# Patient Record
Sex: Female | Born: 1940 | Race: White | Hispanic: No | Marital: Single | Smoking: Current every day smoker
Health system: Southern US, Community
[De-identification: ages and names within clinical notes are randomized; demographics above are authoritative.]

## PROBLEM LIST (undated history)

## (undated) DIAGNOSIS — E785 Hyperlipidemia, unspecified: Secondary | ICD-10-CM

## (undated) DIAGNOSIS — I10 Essential (primary) hypertension: Secondary | ICD-10-CM

## (undated) DIAGNOSIS — I350 Nonrheumatic aortic (valve) stenosis: Secondary | ICD-10-CM

## (undated) DIAGNOSIS — F419 Anxiety disorder, unspecified: Secondary | ICD-10-CM

## (undated) DIAGNOSIS — R011 Cardiac murmur, unspecified: Secondary | ICD-10-CM

## (undated) DIAGNOSIS — I739 Peripheral vascular disease, unspecified: Secondary | ICD-10-CM

## (undated) DIAGNOSIS — C44621 Squamous cell carcinoma of skin of unspecified upper limb, including shoulder: Secondary | ICD-10-CM

## (undated) DIAGNOSIS — I6523 Occlusion and stenosis of bilateral carotid arteries: Secondary | ICD-10-CM

## (undated) DIAGNOSIS — K219 Gastro-esophageal reflux disease without esophagitis: Secondary | ICD-10-CM

## (undated) DIAGNOSIS — E119 Type 2 diabetes mellitus without complications: Secondary | ICD-10-CM

## (undated) HISTORY — PX: OTHER SURGICAL HISTORY: SHX169

---

## 1971-07-25 HISTORY — PX: VAGINAL HYSTERECTOMY: SUR661

## 2000-08-05 ENCOUNTER — Emergency Department (HOSPITAL_COMMUNITY): Admission: EM | Admit: 2000-08-05 | Discharge: 2000-08-05 | Payer: Self-pay | Admitting: Internal Medicine

## 2000-08-05 ENCOUNTER — Encounter: Payer: Self-pay | Admitting: Internal Medicine

## 2000-12-26 ENCOUNTER — Other Ambulatory Visit: Admission: RE | Admit: 2000-12-26 | Discharge: 2000-12-26 | Payer: Self-pay | Admitting: Otolaryngology

## 2000-12-26 ENCOUNTER — Encounter (INDEPENDENT_AMBULATORY_CARE_PROVIDER_SITE_OTHER): Payer: Self-pay | Admitting: Specialist

## 2003-01-01 ENCOUNTER — Other Ambulatory Visit: Admission: RE | Admit: 2003-01-01 | Discharge: 2003-01-01 | Payer: Self-pay | Admitting: Family Medicine

## 2004-02-26 ENCOUNTER — Encounter: Admission: RE | Admit: 2004-02-26 | Discharge: 2004-02-26 | Payer: Self-pay | Admitting: Family Medicine

## 2005-03-10 ENCOUNTER — Encounter: Admission: RE | Admit: 2005-03-10 | Discharge: 2005-03-10 | Payer: Self-pay | Admitting: Family Medicine

## 2006-03-09 ENCOUNTER — Encounter: Admission: RE | Admit: 2006-03-09 | Discharge: 2006-03-09 | Payer: Self-pay | Admitting: Family Medicine

## 2006-03-13 ENCOUNTER — Encounter: Admission: RE | Admit: 2006-03-13 | Discharge: 2006-03-13 | Payer: Self-pay | Admitting: Family Medicine

## 2006-03-14 ENCOUNTER — Encounter: Admission: RE | Admit: 2006-03-14 | Discharge: 2006-03-14 | Payer: Self-pay | Admitting: Family Medicine

## 2008-11-04 ENCOUNTER — Encounter: Admission: RE | Admit: 2008-11-04 | Discharge: 2008-12-22 | Payer: Self-pay | Admitting: Rheumatology

## 2010-10-05 ENCOUNTER — Other Ambulatory Visit: Payer: Self-pay | Admitting: Internal Medicine

## 2010-10-05 DIAGNOSIS — R0989 Other specified symptoms and signs involving the circulatory and respiratory systems: Secondary | ICD-10-CM

## 2010-10-06 ENCOUNTER — Ambulatory Visit
Admission: RE | Admit: 2010-10-06 | Discharge: 2010-10-06 | Disposition: A | Discharge status: Home / Self Care | Payer: Medicare Other | Source: Ambulatory Visit | Attending: Internal Medicine | Admitting: Internal Medicine

## 2010-10-06 DIAGNOSIS — R0989 Other specified symptoms and signs involving the circulatory and respiratory systems: Secondary | ICD-10-CM

## 2012-10-14 ENCOUNTER — Other Ambulatory Visit: Payer: Self-pay | Admitting: Internal Medicine

## 2012-10-14 DIAGNOSIS — I6523 Occlusion and stenosis of bilateral carotid arteries: Secondary | ICD-10-CM

## 2012-10-18 ENCOUNTER — Ambulatory Visit
Admission: RE | Admit: 2012-10-18 | Discharge: 2012-10-18 | Disposition: A | Discharge status: Home / Self Care | Payer: Medicare PPO | Source: Ambulatory Visit | Attending: Internal Medicine | Admitting: Internal Medicine

## 2012-10-18 DIAGNOSIS — I6523 Occlusion and stenosis of bilateral carotid arteries: Secondary | ICD-10-CM

## 2013-08-26 ENCOUNTER — Encounter (HOSPITAL_COMMUNITY): Payer: Self-pay | Admitting: Emergency Medicine

## 2013-08-26 ENCOUNTER — Inpatient Hospital Stay (HOSPITAL_COMMUNITY)
Admission: EM | Admit: 2013-08-26 | Discharge: 2013-08-30 | DRG: 563 | Disposition: A | Discharge status: Home Health | Payer: Medicare PPO | Attending: Orthopedic Surgery | Admitting: Orthopedic Surgery

## 2013-08-26 ENCOUNTER — Inpatient Hospital Stay (HOSPITAL_COMMUNITY): Payer: Medicare PPO

## 2013-08-26 ENCOUNTER — Emergency Department (HOSPITAL_COMMUNITY): Payer: Medicare PPO

## 2013-08-26 DIAGNOSIS — I658 Occlusion and stenosis of other precerebral arteries: Secondary | ICD-10-CM | POA: Diagnosis present

## 2013-08-26 DIAGNOSIS — I359 Nonrheumatic aortic valve disorder, unspecified: Secondary | ICD-10-CM | POA: Diagnosis present

## 2013-08-26 DIAGNOSIS — J441 Chronic obstructive pulmonary disease with (acute) exacerbation: Secondary | ICD-10-CM | POA: Diagnosis present

## 2013-08-26 DIAGNOSIS — S82201A Unspecified fracture of shaft of right tibia, initial encounter for closed fracture: Secondary | ICD-10-CM | POA: Diagnosis present

## 2013-08-26 DIAGNOSIS — S82209A Unspecified fracture of shaft of unspecified tibia, initial encounter for closed fracture: Principal | ICD-10-CM | POA: Diagnosis present

## 2013-08-26 DIAGNOSIS — N182 Chronic kidney disease, stage 2 (mild): Secondary | ICD-10-CM | POA: Diagnosis present

## 2013-08-26 DIAGNOSIS — I5032 Chronic diastolic (congestive) heart failure: Secondary | ICD-10-CM | POA: Diagnosis present

## 2013-08-26 DIAGNOSIS — Z7982 Long term (current) use of aspirin: Secondary | ICD-10-CM

## 2013-08-26 DIAGNOSIS — F17200 Nicotine dependence, unspecified, uncomplicated: Secondary | ICD-10-CM | POA: Diagnosis present

## 2013-08-26 DIAGNOSIS — E119 Type 2 diabetes mellitus without complications: Secondary | ICD-10-CM | POA: Diagnosis present

## 2013-08-26 DIAGNOSIS — I509 Heart failure, unspecified: Secondary | ICD-10-CM | POA: Diagnosis present

## 2013-08-26 DIAGNOSIS — I10 Essential (primary) hypertension: Secondary | ICD-10-CM

## 2013-08-26 DIAGNOSIS — J4489 Other specified chronic obstructive pulmonary disease: Secondary | ICD-10-CM | POA: Diagnosis present

## 2013-08-26 DIAGNOSIS — R062 Wheezing: Secondary | ICD-10-CM

## 2013-08-26 DIAGNOSIS — I350 Nonrheumatic aortic (valve) stenosis: Secondary | ICD-10-CM

## 2013-08-26 DIAGNOSIS — I739 Peripheral vascular disease, unspecified: Secondary | ICD-10-CM | POA: Diagnosis present

## 2013-08-26 DIAGNOSIS — W19XXXA Unspecified fall, initial encounter: Secondary | ICD-10-CM | POA: Diagnosis present

## 2013-08-26 DIAGNOSIS — R809 Proteinuria, unspecified: Secondary | ICD-10-CM | POA: Diagnosis present

## 2013-08-26 DIAGNOSIS — S82109A Unspecified fracture of upper end of unspecified tibia, initial encounter for closed fracture: Secondary | ICD-10-CM | POA: Diagnosis present

## 2013-08-26 DIAGNOSIS — S82401A Unspecified fracture of shaft of right fibula, initial encounter for closed fracture: Secondary | ICD-10-CM

## 2013-08-26 DIAGNOSIS — I6523 Occlusion and stenosis of bilateral carotid arteries: Secondary | ICD-10-CM | POA: Diagnosis present

## 2013-08-26 DIAGNOSIS — R7989 Other specified abnormal findings of blood chemistry: Secondary | ICD-10-CM | POA: Diagnosis present

## 2013-08-26 DIAGNOSIS — J449 Chronic obstructive pulmonary disease, unspecified: Secondary | ICD-10-CM

## 2013-08-26 DIAGNOSIS — D62 Acute posthemorrhagic anemia: Secondary | ICD-10-CM | POA: Diagnosis present

## 2013-08-26 DIAGNOSIS — E785 Hyperlipidemia, unspecified: Secondary | ICD-10-CM | POA: Diagnosis present

## 2013-08-26 DIAGNOSIS — R011 Cardiac murmur, unspecified: Secondary | ICD-10-CM | POA: Diagnosis present

## 2013-08-26 DIAGNOSIS — I129 Hypertensive chronic kidney disease with stage 1 through stage 4 chronic kidney disease, or unspecified chronic kidney disease: Secondary | ICD-10-CM | POA: Diagnosis present

## 2013-08-26 HISTORY — DX: Hyperlipidemia, unspecified: E78.5

## 2013-08-26 HISTORY — DX: Peripheral vascular disease, unspecified: I73.9

## 2013-08-26 HISTORY — DX: Nonrheumatic aortic (valve) stenosis: I35.0

## 2013-08-26 HISTORY — DX: Type 2 diabetes mellitus without complications: E11.9

## 2013-08-26 HISTORY — DX: Essential (primary) hypertension: I10

## 2013-08-26 HISTORY — DX: Occlusion and stenosis of bilateral carotid arteries: I65.23

## 2013-08-26 LAB — COMPREHENSIVE METABOLIC PANEL
ALBUMIN: 3.1 g/dL — AB (ref 3.5–5.2)
ALT: 14 U/L (ref 0–35)
AST: 17 U/L (ref 0–37)
Alkaline Phosphatase: 74 U/L (ref 39–117)
BILIRUBIN TOTAL: 0.3 mg/dL (ref 0.3–1.2)
BUN: 23 mg/dL (ref 6–23)
CALCIUM: 8.7 mg/dL (ref 8.4–10.5)
CHLORIDE: 100 meq/L (ref 96–112)
CO2: 22 mEq/L (ref 19–32)
CREATININE: 1.79 mg/dL — AB (ref 0.50–1.10)
GFR calc Af Amer: 31 mL/min — ABNORMAL LOW (ref >90)
GFR calc non Af Amer: 27 mL/min — ABNORMAL LOW (ref >90)
Glucose, Bld: 142 mg/dL — ABNORMAL HIGH (ref 70–99)
Potassium: 4.5 mEq/L (ref 3.7–5.3)
Sodium: 137 mEq/L (ref 137–147)
Total Protein: 6.4 g/dL (ref 6.0–8.3)

## 2013-08-26 LAB — CBC WITH DIFFERENTIAL/PLATELET
Basophils Absolute: 0.1 10*3/uL (ref 0.0–0.1)
Basophils Relative: 1 % (ref 0–1)
Eosinophils Absolute: 0.3 10*3/uL (ref 0.0–0.7)
Eosinophils Relative: 2 % (ref 0–5)
HCT: 34.1 % — ABNORMAL LOW (ref 36.0–46.0)
Hemoglobin: 11.8 g/dL — ABNORMAL LOW (ref 12.0–15.0)
LYMPHS ABS: 1 10*3/uL (ref 0.7–4.0)
LYMPHS PCT: 8 % — AB (ref 12–46)
MCH: 29.5 pg (ref 26.0–34.0)
MCHC: 34.6 g/dL (ref 30.0–36.0)
MCV: 85.3 fL (ref 78.0–100.0)
Monocytes Absolute: 0.9 10*3/uL (ref 0.1–1.0)
Monocytes Relative: 7 % (ref 3–12)
NEUTROS ABS: 10.6 10*3/uL — AB (ref 1.7–7.7)
NEUTROS PCT: 82 % — AB (ref 43–77)
PLATELETS: 376 10*3/uL (ref 150–400)
RBC: 4 MIL/uL (ref 3.87–5.11)
RDW: 12.8 % (ref 11.5–15.5)
WBC: 12.8 10*3/uL — AB (ref 4.0–10.5)

## 2013-08-26 LAB — URINALYSIS, ROUTINE W REFLEX MICROSCOPIC
Bilirubin Urine: NEGATIVE
GLUCOSE, UA: NEGATIVE mg/dL
HGB URINE DIPSTICK: NEGATIVE
Ketones, ur: NEGATIVE mg/dL
Leukocytes, UA: NEGATIVE
Nitrite: NEGATIVE
Protein, ur: >300 mg/dL — AB
SPECIFIC GRAVITY, URINE: 1.015 (ref 1.005–1.030)
Urobilinogen, UA: 0.2 mg/dL (ref 0.0–1.0)
pH: 5.5 (ref 5.0–8.0)

## 2013-08-26 LAB — BASIC METABOLIC PANEL
BUN: 21 mg/dL (ref 6–23)
CHLORIDE: 98 meq/L (ref 96–112)
CO2: 24 meq/L (ref 19–32)
Calcium: 9.1 mg/dL (ref 8.4–10.5)
Creatinine, Ser: 1.65 mg/dL — ABNORMAL HIGH (ref 0.50–1.10)
GFR calc Af Amer: 35 mL/min — ABNORMAL LOW (ref >90)
GFR calc non Af Amer: 30 mL/min — ABNORMAL LOW (ref >90)
GLUCOSE: 174 mg/dL — AB (ref 70–99)
POTASSIUM: 4.3 meq/L (ref 3.7–5.3)
SODIUM: 137 meq/L (ref 137–147)

## 2013-08-26 LAB — CBC
HCT: 31.8 % — ABNORMAL LOW (ref 36.0–46.0)
Hemoglobin: 11.1 g/dL — ABNORMAL LOW (ref 12.0–15.0)
MCH: 29.7 pg (ref 26.0–34.0)
MCHC: 34.9 g/dL (ref 30.0–36.0)
MCV: 85 fL (ref 78.0–100.0)
Platelets: 393 10*3/uL (ref 150–400)
RBC: 3.74 MIL/uL — ABNORMAL LOW (ref 3.87–5.11)
RDW: 12.8 % (ref 11.5–15.5)
WBC: 14.8 10*3/uL — ABNORMAL HIGH (ref 4.0–10.5)

## 2013-08-26 LAB — GLUCOSE, CAPILLARY
Glucose-Capillary: 182 mg/dL — ABNORMAL HIGH (ref 70–99)
Glucose-Capillary: 139 mg/dL — ABNORMAL HIGH (ref 70–99)
Glucose-Capillary: 140 mg/dL — ABNORMAL HIGH (ref 70–99)
Glucose-Capillary: 148 mg/dL — ABNORMAL HIGH (ref 70–99)

## 2013-08-26 LAB — URINE MICROSCOPIC-ADD ON

## 2013-08-26 LAB — TYPE AND SCREEN
ABO/RH(D): O POS
ANTIBODY SCREEN: NEGATIVE

## 2013-08-26 LAB — PROTIME-INR
INR: 0.94 (ref 0.00–1.49)
Prothrombin Time: 12.4 seconds (ref 11.6–15.2)

## 2013-08-26 LAB — APTT: APTT: 32 s (ref 24–37)

## 2013-08-26 LAB — ABO/RH: ABO/RH(D): O POS

## 2013-08-26 MED ORDER — SODIUM CHLORIDE 0.9 % IV SOLN
INTRAVENOUS | Status: AC
  Administered 2013-08-26: 12:00:00 via INTRAVENOUS

## 2013-08-26 MED ORDER — INSULIN ASPART 100 UNIT/ML ~~LOC~~ SOLN
0.0000 [IU] | Freq: Three times a day (TID) | SUBCUTANEOUS | Status: DC
  Administered 2013-08-27 – 2013-08-29 (×5): 2 [IU] via SUBCUTANEOUS

## 2013-08-26 MED ORDER — DOCUSATE SODIUM 100 MG PO CAPS
100.0000 mg | ORAL_CAPSULE | Freq: Two times a day (BID) | ORAL | Status: DC
  Administered 2013-08-27 – 2013-08-30 (×6): 100 mg via ORAL
  Filled 2013-08-26 (×8): qty 1

## 2013-08-26 MED ORDER — SIMVASTATIN 5 MG PO TABS
5.0000 mg | ORAL_TABLET | Freq: Every day | ORAL | Status: DC
  Administered 2013-08-27 – 2013-08-30 (×4): 5 mg via ORAL
  Filled 2013-08-26 (×5): qty 1

## 2013-08-26 MED ORDER — SENNOSIDES-DOCUSATE SODIUM 8.6-50 MG PO TABS
1.0000 | ORAL_TABLET | Freq: Every evening | ORAL | Status: DC | PRN
Start: 2013-08-26 — End: 2013-08-30

## 2013-08-26 MED ORDER — MORPHINE SULFATE 2 MG/ML IJ SOLN: 1.0000 mg | INTRAMUSCULAR | Status: DC | PRN

## 2013-08-26 MED ORDER — SODIUM CHLORIDE 0.9 % IV SOLN
INTRAVENOUS | Status: DC
  Administered 2013-08-26 – 2013-08-28 (×7): via INTRAVENOUS

## 2013-08-26 MED ORDER — ONDANSETRON HCL 4 MG/2ML IJ SOLN
4.0000 mg | Freq: Three times a day (TID) | INTRAMUSCULAR | Status: DC
  Administered 2013-08-26 – 2013-08-27 (×2): 4 mg via INTRAVENOUS
  Filled 2013-08-26 (×3): qty 2

## 2013-08-26 MED ORDER — FENTANYL CITRATE 0.05 MG/ML IJ SOLN: 50.0000 ug | INTRAMUSCULAR | Status: DC | PRN

## 2013-08-26 MED ORDER — ONDANSETRON HCL 4 MG/2ML IJ SOLN
4.0000 mg | Freq: Three times a day (TID) | INTRAMUSCULAR | Status: AC | PRN
  Administered 2013-08-26 (×2): 4 mg via INTRAVENOUS
  Filled 2013-08-26 (×2): qty 2

## 2013-08-26 MED ORDER — FENTANYL CITRATE 0.05 MG/ML IJ SOLN
50.0000 ug | Freq: Once | INTRAMUSCULAR | Status: DC
Start: 2013-08-26 — End: 2013-08-27

## 2013-08-26 MED ORDER — METFORMIN HCL 500 MG PO TABS: 500.0000 mg | ORAL_TABLET | Freq: Two times a day (BID) | ORAL | Status: DC

## 2013-08-26 MED ORDER — MORPHINE SULFATE 4 MG/ML IJ SOLN
4.0000 mg | INTRAMUSCULAR | Status: DC | PRN
  Administered 2013-08-26 (×4): 4 mg via INTRAVENOUS
  Filled 2013-08-26 (×4): qty 1

## 2013-08-26 MED ORDER — ALPRAZOLAM 0.5 MG PO TABS: 0.5000 mg | ORAL_TABLET | Freq: Three times a day (TID) | ORAL | Status: DC | PRN

## 2013-08-26 MED ORDER — AMLODIPINE BESYLATE 2.5 MG PO TABS
2.5000 mg | ORAL_TABLET | Freq: Every day | ORAL | Status: DC
  Administered 2013-08-27: 2.5 mg via ORAL
  Filled 2013-08-26 (×3): qty 1

## 2013-08-26 NOTE — ED Notes (Signed)
Pt has sensation to right foot and moderate +2 dorsalis pedis pulses to feet bilaterally. Ortho tech at bedside applying posterior long leg splint.

## 2013-08-26 NOTE — Progress Notes (Signed)
UR completed 

## 2013-08-26 NOTE — ED Notes (Signed)
Pt reports nausea but refused nausea medication at present time.

## 2013-08-26 NOTE — Progress Notes (Signed)
PHARMACIST - PHYSICIAN COMMUNICATION DR:  Manson Passey CONCERNING:  METFORMIN SAFE ADMINISTRATION POLICY  RECOMMENDATION: Metformin has been placed on DISCONTINUE (rejected order) STATUS and should be reordered only after any of the conditions below are ruled out.  Current safety recommendations include avoiding metformin for a minimum of 48 hours after the patient's exposure to intravenous contrast media.  DESCRIPTION:  The Pharmacy Committee has adopted a policy that restricts the use of metformin in hospitalized patients until all the contraindications to administration have been ruled out. Specific contraindications are: []  Serum creatinine ? 1.5 for males [x]  Serum creatinine ? 1.4 for females []  Shock, acute MI, sepsis, hypoxemia, dehydration []  Planned administration of intravenous iodinated contrast media []  Heart Failure patients with low EF []  Acute or chronic metabolic acidosis (including DKA)   Uvaldo Rising, BCPS  Clinical Pharmacist Pager [phone]  08/26/2013 9:08 PM

## 2013-08-26 NOTE — ED Provider Notes (Signed)
CSN: [id number]     Arrival date & time 08/26/13  0916 History   First MD Initiated Contact with Patient 08/26/13 [phone]     Chief Complaint  Patient presents with  . Fall   (Consider location/radiation/quality/duration/timing/severity/associated sxs/prior Treatment) HPI Comments: 73 yo female with smoking, DM hx presents with right leg pain after falling on the on the cement floor due to missing last step walking to basement in the dark, power was out.  Pt has had fracture of left leg before. Pain with movement.  Pt walked initially.  ASA is blood thinner.  No head injury or loc.  Occurred PTA.  Smoker.    Patient is a 73 y.o. female presenting with fall. The history is provided by the patient.  Fall Pertinent negatives include no chest pain, no abdominal pain, no headaches and no shortness of breath.    Past Medical History  Diagnosis Date  . Diabetes mellitus without complication   . Hypertension   . Hyperlipemia    History reviewed. No pertinent past surgical history. No family history on file. History  Substance Use Topics  . Smoking status: Current Every Day Smoker -- 0.50 packs/day    Types: Cigarettes  . Smokeless tobacco: Not on file  . Alcohol Use: No   OB History   Grav Para Term Preterm Abortions TAB SAB Ect Mult Living                 Review of Systems  Constitutional: Negative for fever and chills.  HENT: Negative for congestion.   Eyes: Negative for visual disturbance.  Respiratory: Negative for shortness of breath.   Cardiovascular: Negative for chest pain.  Gastrointestinal: Negative for vomiting and abdominal pain.  Genitourinary: Negative for dysuria and flank pain.  Musculoskeletal: Positive for arthralgias, gait problem and joint swelling. Negative for back pain, neck pain and neck stiffness.  Skin: Negative for rash.  Neurological: Negative for light-headedness and headaches.    Allergies  Review of patient's allergies indicates no known  allergies.  Home Medications   Current Outpatient Rx  Name  Route  Sig  Dispense  Refill  . ALPRAZolam (XANAX) 0.5 MG tablet   Oral   Take 0.5 mg by mouth as needed for anxiety.         Marland Kitchen amLODipine (NORVASC) 2.5 MG tablet   Oral   Take 2.5 mg by mouth daily.         Marland Kitchen aspirin 325 MG tablet   Oral   Take 325 mg by mouth daily.         . calcium carbonate (OS-CAL) 600 MG TABS tablet   Oral   Take 600 mg by mouth 2 (two) times daily with a meal.         . Coenzyme Q10 (CO Q 10) 100 MG CAPS   Oral   Take 1 capsule by mouth daily.         . cyanocobalamin 1000 MCG tablet   Oral   Take 100 mcg by mouth daily.         . cyanocobalamin 500 MCG tablet   Oral   Take 500 mcg by mouth daily.         . metFORMIN (GLUCOPHAGE) 500 MG tablet   Oral   Take 500 mg by mouth 2 (two) times daily with a meal.         . Multiple Vitamins-Minerals (MULTIVITAMIN WITH MINERALS) tablet   Oral   Take 1 tablet by  mouth daily.         Marland Kitchen omega-3 acid ethyl esters (LOVAZA) 1 G capsule   Oral   Take 1 g by mouth daily.         . simvastatin (ZOCOR) 5 MG tablet   Oral   Take 5 mg by mouth daily.          BP 120/52  Pulse 96  Temp(Src) 98.6 F (37 C) (Oral)  Resp 18  SpO2 94% Physical Exam  Nursing note and vitals reviewed. Constitutional: She is oriented to person, place, and time. She appears well-developed and well-nourished.  HENT:  Head: Normocephalic and atraumatic.  Eyes: Conjunctivae are normal. Right eye exhibits no discharge. Left eye exhibits no discharge.  Neck: Normal range of motion. Neck supple. No tracheal deviation present.  Cardiovascular: Normal rate and regular rhythm.   Pulmonary/Chest: Effort normal and breath sounds normal.  Abdominal: Soft. She exhibits no distension. There is no tenderness. There is no guarding.  Musculoskeletal: She exhibits edema and tenderness.  Tender and deformity prox tibia, swelling lateral knee and proximal  tibia with mild ecchymosis, closed, NV intact distal, swelling but soft compartment No hip pain bilateral. Full rom of left hip. Knee and ankle  Neurological: She is alert and oriented to person, place, and time.  Skin: Skin is warm. No rash noted.  Psychiatric: She has a normal mood and affect.    ED Course  Procedures (including critical care time) Labs Review Labs Reviewed  GLUCOSE, CAPILLARY - Abnormal; Notable for the following:    Glucose-Capillary 182 (*)    All other components within normal limits  CBC WITH DIFFERENTIAL - Abnormal; Notable for the following:    WBC 12.8 (*)    Hemoglobin 11.8 (*)    HCT 34.1 (*)    Neutrophils Relative % 82 (*)    Neutro Abs 10.6 (*)    Lymphocytes Relative 8 (*)    All other components within normal limits  BASIC METABOLIC PANEL - Abnormal; Notable for the following:    Glucose, Bld 174 (*)    Creatinine, Ser 1.65 (*)    GFR calc non Af Amer 30 (*)    GFR calc Af Amer 35 (*)    All other components within normal limits  TYPE AND SCREEN  ABO/RH   Imaging Review Dg Chest 1 View  08/26/2013   CLINICAL DATA:  Fall today, preop, no chest complaints  EXAM: CHEST - 1 VIEW  COMPARISON:  03/09/2006  FINDINGS: Mild background interstitial changes stable. Heart size and vascular pattern are normal. Lungs are clear. No effusion or pneumothorax.  IMPRESSION: No active disease.   Electronically Signed   By: Skipper Cliche M.D.   On: 08/26/2013 10:49   Dg Hip Complete Right  08/26/2013   CLINICAL DATA:  Fall today with pain and upper tibia and fibula  EXAM: RIGHT HIP - COMPLETE 2+ VIEW  COMPARISON:  None.  FINDINGS: There is no evidence of hip fracture or dislocation. There is no evidence of arthropathy or other focal bone abnormality.  IMPRESSION: Negative.   Electronically Signed   By: Skipper Cliche M.D.   On: 08/26/2013 11:07   Dg Knee 2 Views Right  08/26/2013   CLINICAL DATA:  Pain post fall  EXAM: RIGHT KNEE - 1-2 VIEW  COMPARISON:  None   FINDINGS: Osseous demineralization.  Markedly comminuted and displaced proximal right tibial meta diaphyseal fracture.  Probable extension of fracture planes into lateral compartment.  Femur  and patella appear grossly intact.  Large knee joint effusion.  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  Displaced fibular neck fracture.  IMPRESSION: Comminuted proximal right tibial meta diaphyseal fracture with probable intra-articular extension at lateral compartment.  Displaced right fibular neck fracture.  Marked osseous demineralization with degenerative changes and large joint effusion.   Electronically Signed   By: Lavonia Dana M.D.   On: 08/26/2013 10:53   Dg Tibia/fibula Right  08/26/2013   CLINICAL DATA:  Fall today, pain in upper right tibia and fibula  EXAM: RIGHT TIBIA AND FIBULA - 2 VIEW  COMPARISON:  None.  FINDINGS: There is a mildly displaced significantly comminuted fracture of the proximal tibia. This extends from the central tibial plateau through the tibial metaphysis and into the proximal third of the tibial shaft. There is also a mildly comminuted and mildly displaced fracture of the neck of the fibula.  Incidental note is made of popliteal vascular calcification and of nonspecific subcutaneous calcification anterior inferior to the patella. There appears to be a small knee joint effusion.  IMPRESSION: Comminuted mildly displaced fractures of the proximal tibia and fibula   Electronically Signed   By: Skipper Cliche M.D.   On: 08/26/2013 11:06    EKG Interpretation   None       MDM   1. Closed right tibial fracture   2. Fall   3. Right fibular fracture    Mechanical fall. Pain meds ordered. Xray reviewed by myself, complex prox tibial/ plateau fx.  Spoke with ortho, Dr Rolena Infante arranged transfer to Dr Marcelino Scot at [facility] for surgery.  Rechecked, pain controlled, nv intact, ortho tech arrived for knee immobilizer.   The patients results and  plan were reviewed and discussed.   Any x-rays performed were personally reviewed by myself.   Differential diagnosis were considered with the presenting HPI.  Diagnosis: above  Admission/ observation were discussed with the admitting physician, patient and/or family and they are comfortable with the plan.     Mariea Clonts, MD 08/26/13 [phone]

## 2013-08-26 NOTE — H&P (Signed)
Agree with above Spoke with Dr Marcelino Scot  - transfer to [facility] for ORIF     - bulky LE splint in place    - CT scan pending

## 2013-08-26 NOTE — ED Notes (Signed)
Pt resting at present time and RA sat of 89%. Pt placed on 2 lpm [place]. Pt a/o x4.

## 2013-08-26 NOTE — ED Notes (Addendum)
Pt reports pain increasing 8/10. MD Rolena Infante notified regarding pt pain level and next dose not scheduled for another hour. MD Karlene Lineman to consult EDP regarding pt condition. EDP J Knapp verbalizes given 4 mg of morphine now with knowledge last dose given at 1705. Pt reports numbness to right foot. Pt has good cap refill and able to move toes. EDP Hillard Danker request ortho to adjust splint for comfort. Ortho paged and at bedside. Pt a/o x4.

## 2013-08-26 NOTE — ED Notes (Signed)
[facility] called by A Cox at time of pt bed assignment. A  Cox called [facility] to check on transfer status. [facility] staff report transfer will be within next 45 minutes.

## 2013-08-26 NOTE — ED Notes (Signed)
Pt vomited diet coke due to nausea. Pt drinking slow sips of ginger ale at present time.

## 2013-08-26 NOTE — ED Notes (Addendum)
Spoke with Heather on 5N to update on pt status. Aware pt on 2 lpm [place] and [facility] report will transfer in approx 45 minutes.

## 2013-08-26 NOTE — ED Notes (Signed)
Ortho consult at bedside

## 2013-08-26 NOTE — ED Notes (Signed)
Pt transported to CT ?

## 2013-08-26 NOTE — ED Notes (Signed)
Dr Alyson Ingles called regarding pt request to eat. Current status NPO. MD reports will put in for regular schedule and pt NPO after midnight. Pt given diet coke at present time per pt request to settle stomach.

## 2013-08-26 NOTE — ED Notes (Addendum)
Per EMS pt fell going down stairs. On arrival pt had moved self up to top of stairs. Pt a/ox4 with no LOC. Pt ambulates at home independently. Swelling to right leg at knee and mid lower leg. Pt denies numbness to injured area. Soft splint applied to RLE per EMS.

## 2013-08-26 NOTE — ED Notes (Signed)
Pt denies pain medication at present time. Pt states will inform staff if would like something for pain.

## 2013-08-26 NOTE — ED Notes (Signed)
Bed: HY38 Expected date:  Expected time:  Means of arrival:  Comments: EMS FALL

## 2013-08-26 NOTE — ED Notes (Signed)
Pt reports pain decreased 5/10 in right leg. Pt reports will alert staff with increase in pain.

## 2013-08-26 NOTE — H&P (Signed)
Lindsey Weber is an 73 y.o. female.   Chief Complaint:  Right tib/fib fracture HPI:  Patient states that earlier today she missed the bottom step and fell.  Severe pain in right leg and was unable to weightbear.  Past Medical History  Diagnosis Date  . Diabetes mellitus without complication   . Hypertension   . Hyperlipemia     Past Surgical History  Procedure Laterality Date  . Bladder tack    . Abdominal hysterectomy      PARTIAL    History reviewed. No pertinent family history. Social History:  reports that she has been smoking Cigarettes.  She has been smoking about 0.50 packs per day. She has never used smokeless tobacco. She reports that she does not drink alcohol or use illicit drugs.  Allergies: No Known Allergies   (Not in a hospital admission)  Results for orders placed during the hospital encounter of 08/26/13 (from the past 48 hour(s))  GLUCOSE, CAPILLARY     Status: Abnormal   Collection Time    08/26/13  9:18 AM      Result Value Range   Glucose-Capillary 182 (*) 70 - 99 mg/dL  ABO/RH     Status: None   Collection Time    08/26/13  9:54 AM      Result Value Range   ABO/RH(D) O POS    CBC WITH DIFFERENTIAL     Status: Abnormal   Collection Time    08/26/13 10:05 AM      Result Value Range   WBC 12.8 (*) 4.0 - 10.5 K/uL   RBC 4.00  3.87 - 5.11 MIL/uL   Hemoglobin 11.8 (*) 12.0 - 15.0 g/dL   HCT 57.3 (*) 34.4 - 83.0 %   MCV 85.3  78.0 - 100.0 fL   MCH 29.5  26.0 - 34.0 pg   MCHC 34.6  30.0 - 36.0 g/dL   RDW 15.9  96.8 - 95.7 %   Platelets 376  150 - 400 K/uL   Neutrophils Relative % 82 (*) 43 - 77 %   Neutro Abs 10.6 (*) 1.7 - 7.7 K/uL   Lymphocytes Relative 8 (*) 12 - 46 %   Lymphs Abs 1.0  0.7 - 4.0 K/uL   Monocytes Relative 7  3 - 12 %   Monocytes Absolute 0.9  0.1 - 1.0 K/uL   Eosinophils Relative 2  0 - 5 %   Eosinophils Absolute 0.3  0.0 - 0.7 K/uL   Basophils Relative 1  0 - 1 %   Basophils Absolute 0.1  0.0 - 0.1 K/uL  BASIC METABOLIC  PANEL     Status: Abnormal   Collection Time    08/26/13 10:05 AM      Result Value Range   Sodium 137  137 - 147 mEq/L   Potassium 4.3  3.7 - 5.3 mEq/L   Chloride 98  96 - 112 mEq/L   CO2 24  19 - 32 mEq/L   Glucose, Bld 174 (*) 70 - 99 mg/dL   BUN 21  6 - 23 mg/dL   Creatinine, Ser 0.22 (*) 0.50 - 1.10 mg/dL   Calcium 9.1  8.4 - 02.6 mg/dL   GFR calc non Af Amer 30 (*) >90 mL/min   GFR calc Af Amer 35 (*) >90 mL/min   Comment: (NOTE)     The eGFR has been calculated using the CKD EPI equation.     This calculation has not been validated in all clinical  situations.     eGFR's persistently <90 mL/min signify possible Chronic Kidney     Disease.  TYPE AND SCREEN     Status: None   Collection Time    08/26/13 10:05 AM      Result Value Range   ABO/RH(D) O POS     Antibody Screen NEG     Sample Expiration 08/29/2013     Dg Chest 1 View  08/26/2013   CLINICAL DATA:  Fall today, preop, no chest complaints  EXAM: CHEST - 1 VIEW  COMPARISON:  03/09/2006  FINDINGS: Mild background interstitial changes stable. Heart size and vascular pattern are normal. Lungs are clear. No effusion or pneumothorax.  IMPRESSION: No active disease.   Electronically Signed   By: Skipper Cliche M.D.   On: 08/26/2013 10:49   Dg Hip Complete Right  08/26/2013   CLINICAL DATA:  Fall today with pain and upper tibia and fibula  EXAM: RIGHT HIP - COMPLETE 2+ VIEW  COMPARISON:  None.  FINDINGS: There is no evidence of hip fracture or dislocation. There is no evidence of arthropathy or other focal bone abnormality.  IMPRESSION: Negative.   Electronically Signed   By: Skipper Cliche M.D.   On: 08/26/2013 11:07   Dg Knee 2 Views Right  08/26/2013   CLINICAL DATA:  Pain post fall  EXAM: RIGHT KNEE - 1-2 VIEW  COMPARISON:  None  FINDINGS: Osseous demineralization.  Markedly comminuted and displaced proximal right tibial meta diaphyseal fracture.  Probable extension of fracture planes into lateral compartment.  Femur and  patella appear grossly intact.  Large knee joint effusion.  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  Displaced fibular neck fracture.  IMPRESSION: Comminuted proximal right tibial meta diaphyseal fracture with probable intra-articular extension at lateral compartment.  Displaced right fibular neck fracture.  Marked osseous demineralization with degenerative changes and large joint effusion.   Electronically Signed   By: Lavonia Dana M.D.   On: 08/26/2013 10:53   Dg Tibia/fibula Right  08/26/2013   CLINICAL DATA:  Fall today, pain in upper right tibia and fibula  EXAM: RIGHT TIBIA AND FIBULA - 2 VIEW  COMPARISON:  None.  FINDINGS: There is a mildly displaced significantly comminuted fracture of the proximal tibia. This extends from the central tibial plateau through the tibial metaphysis and into the proximal third of the tibial shaft. There is also a mildly comminuted and mildly displaced fracture of the neck of the fibula.  Incidental note is made of popliteal vascular calcification and of nonspecific subcutaneous calcification anterior inferior to the patella. There appears to be a small knee joint effusion.  IMPRESSION: Comminuted mildly displaced fractures of the proximal tibia and fibula   Electronically Signed   By: Skipper Cliche M.D.   On: 08/26/2013 11:06    Review of Systems  Constitutional: Negative.   HENT: Negative.   Eyes: Negative.   Respiratory: Negative.   Cardiovascular: Negative.   Gastrointestinal: Negative.   Musculoskeletal: Positive for falls and joint pain.  Neurological: Negative.   Psychiatric/Behavioral: Negative.     Blood pressure 116/51, pulse 92, temperature 98.6 F (37 C), temperature source Oral, resp. rate 18, SpO2 95.00%. Physical Exam  Constitutional: She is oriented to person, place, and time. No distress.  HENT:  Head: Normocephalic and atraumatic.  Eyes: EOM are normal. Pupils are equal, round, and reactive  to light.  Neck: Normal range of motion.  Respiratory: Effort normal. She has wheezes.  GI:  Soft.  Musculoskeletal:  Right le bulky splint on. Good capillary refill. Moves toes well.    Neurological: She is alert and oriented to person, place, and time.  Skin: Skin is warm.     Assessment/Plan Right comminuted tibia fracture.   Dr Rolena Infante spoke with Ortho trauma surgeon Dr Legrand Como handy.  We will transfer patient to [facility] and dr handy will see patient there for treatment.  Order CT tibia.  This will include knee.  Spoke with patient and family member present.  Advised that surgical intervention will very likely be indicated.   Questions answered.    OWENS,JAMES M 08/26/2013, 12:17 PM

## 2013-08-26 NOTE — ED Notes (Signed)
Pt denies pain medication at present time. Pt states she will alert staff if would like something for pain. Pt going to xray at present time.

## 2013-08-27 ENCOUNTER — Inpatient Hospital Stay (HOSPITAL_COMMUNITY): Payer: Medicare PPO

## 2013-08-27 ENCOUNTER — Encounter (HOSPITAL_COMMUNITY): Payer: Self-pay | Admitting: Orthopedic Surgery

## 2013-08-27 DIAGNOSIS — I6523 Occlusion and stenosis of bilateral carotid arteries: Secondary | ICD-10-CM | POA: Diagnosis present

## 2013-08-27 DIAGNOSIS — R062 Wheezing: Secondary | ICD-10-CM

## 2013-08-27 DIAGNOSIS — E119 Type 2 diabetes mellitus without complications: Secondary | ICD-10-CM | POA: Diagnosis present

## 2013-08-27 DIAGNOSIS — R7989 Other specified abnormal findings of blood chemistry: Secondary | ICD-10-CM | POA: Diagnosis present

## 2013-08-27 DIAGNOSIS — E785 Hyperlipidemia, unspecified: Secondary | ICD-10-CM | POA: Diagnosis present

## 2013-08-27 DIAGNOSIS — J449 Chronic obstructive pulmonary disease, unspecified: Secondary | ICD-10-CM

## 2013-08-27 DIAGNOSIS — S82209A Unspecified fracture of shaft of unspecified tibia, initial encounter for closed fracture: Secondary | ICD-10-CM

## 2013-08-27 DIAGNOSIS — J441 Chronic obstructive pulmonary disease with (acute) exacerbation: Secondary | ICD-10-CM | POA: Diagnosis present

## 2013-08-27 DIAGNOSIS — R809 Proteinuria, unspecified: Secondary | ICD-10-CM | POA: Diagnosis present

## 2013-08-27 DIAGNOSIS — I10 Essential (primary) hypertension: Secondary | ICD-10-CM | POA: Diagnosis present

## 2013-08-27 DIAGNOSIS — R799 Abnormal finding of blood chemistry, unspecified: Secondary | ICD-10-CM

## 2013-08-27 DIAGNOSIS — I739 Peripheral vascular disease, unspecified: Secondary | ICD-10-CM | POA: Diagnosis present

## 2013-08-27 DIAGNOSIS — I359 Nonrheumatic aortic valve disorder, unspecified: Secondary | ICD-10-CM

## 2013-08-27 DIAGNOSIS — R011 Cardiac murmur, unspecified: Secondary | ICD-10-CM | POA: Diagnosis present

## 2013-08-27 LAB — URINE CULTURE
COLONY COUNT: NO GROWTH
Culture: NO GROWTH

## 2013-08-27 LAB — GLUCOSE, CAPILLARY
GLUCOSE-CAPILLARY: 136 mg/dL — AB (ref 70–99)
GLUCOSE-CAPILLARY: 135 mg/dL — AB (ref 70–99)
GLUCOSE-CAPILLARY: 139 mg/dL — AB (ref 70–99)
Glucose-Capillary: 133 mg/dL — ABNORMAL HIGH (ref 70–99)
Glucose-Capillary: 133 mg/dL — ABNORMAL HIGH (ref 70–99)

## 2013-08-27 LAB — RENAL FUNCTION PANEL
Albumin: 3 g/dL — ABNORMAL LOW (ref 3.5–5.2)
BUN: 30 mg/dL — AB (ref 6–23)
CHLORIDE: 103 meq/L (ref 96–112)
CO2: 23 mEq/L (ref 19–32)
CREATININE: 2.17 mg/dL — AB (ref 0.50–1.10)
Calcium: 8.1 mg/dL — ABNORMAL LOW (ref 8.4–10.5)
GFR calc Af Amer: 25 mL/min — ABNORMAL LOW (ref >90)
GFR calc non Af Amer: 22 mL/min — ABNORMAL LOW (ref >90)
Glucose, Bld: 137 mg/dL — ABNORMAL HIGH (ref 70–99)
PHOSPHORUS: 3.7 mg/dL (ref 2.3–4.6)
Potassium: 5.1 mEq/L (ref 3.7–5.3)
Sodium: 139 mEq/L (ref 137–147)

## 2013-08-27 LAB — HEMOGLOBIN A1C
HEMOGLOBIN A1C: 7.1 % — AB (ref <5.7)
MEAN PLASMA GLUCOSE: 157 mg/dL — AB (ref <117)

## 2013-08-27 LAB — SURGICAL PCR SCREEN
MRSA, PCR: NEGATIVE
Staphylococcus aureus: NEGATIVE

## 2013-08-27 MED ORDER — LEVOFLOXACIN 500 MG PO TABS
500.0000 mg | ORAL_TABLET | Freq: Every day | ORAL | Status: DC
  Administered 2013-08-27 – 2013-08-29 (×3): 500 mg via ORAL
  Filled 2013-08-27 (×3): qty 1

## 2013-08-27 MED ORDER — DIPHENHYDRAMINE HCL 25 MG PO CAPS: 25.0000 mg | ORAL_CAPSULE | Freq: Four times a day (QID) | ORAL | Status: DC | PRN

## 2013-08-27 MED ORDER — ALBUTEROL SULFATE (2.5 MG/3ML) 0.083% IN NEBU
2.5000 mg | INHALATION_SOLUTION | Freq: Four times a day (QID) | RESPIRATORY_TRACT | Status: DC
  Administered 2013-08-27 – 2013-08-28 (×3): 2.5 mg via RESPIRATORY_TRACT
  Filled 2013-08-27 (×4): qty 3

## 2013-08-27 MED ORDER — ALBUTEROL SULFATE (2.5 MG/3ML) 0.083% IN NEBU: 2.5000 mg | INHALATION_SOLUTION | RESPIRATORY_TRACT | Status: DC | PRN

## 2013-08-27 MED ORDER — ACETAMINOPHEN 325 MG PO TABS
325.0000 mg | ORAL_TABLET | Freq: Four times a day (QID) | ORAL | Status: DC | PRN
  Administered 2013-08-27 – 2013-08-29 (×6): 650 mg via ORAL
  Filled 2013-08-27 (×7): qty 2

## 2013-08-27 MED ORDER — ENOXAPARIN SODIUM 30 MG/0.3ML ~~LOC~~ SOLN
30.0000 mg | SUBCUTANEOUS | Status: DC
  Administered 2013-08-27 – 2013-08-29 (×3): 30 mg via SUBCUTANEOUS
  Filled 2013-08-27 (×4): qty 0.3

## 2013-08-27 MED ORDER — ALBUTEROL SULFATE (2.5 MG/3ML) 0.083% IN NEBU: 2.5000 mg | INHALATION_SOLUTION | Freq: Four times a day (QID) | RESPIRATORY_TRACT | Status: DC | PRN

## 2013-08-27 MED ORDER — ASPIRIN 81 MG PO CHEW
81.0000 mg | CHEWABLE_TABLET | Freq: Every day | ORAL | Status: DC
  Administered 2013-08-28 – 2013-08-30 (×3): 81 mg via ORAL
  Filled 2013-08-27 (×3): qty 1

## 2013-08-27 MED ORDER — METHOCARBAMOL 100 MG/ML IJ SOLN
1000.0000 mg | Freq: Four times a day (QID) | INTRAVENOUS | Status: DC | PRN
  Filled 2013-08-27: qty 10

## 2013-08-27 MED ORDER — HYDROMORPHONE HCL PF 1 MG/ML IJ SOLN
0.5000 mg | INTRAMUSCULAR | Status: DC | PRN
Start: 2013-08-27 — End: 2013-08-30
  Administered 2013-08-27: 0.5 mg via INTRAVENOUS
  Filled 2013-08-27 (×2): qty 1

## 2013-08-27 MED ORDER — HYDROCODONE-ACETAMINOPHEN 5-325 MG PO TABS: 1.0000 | ORAL_TABLET | Freq: Four times a day (QID) | ORAL | Status: DC | PRN

## 2013-08-27 MED ORDER — METHOCARBAMOL 500 MG PO TABS
500.0000 mg | ORAL_TABLET | Freq: Four times a day (QID) | ORAL | Status: DC | PRN
  Administered 2013-08-29: 500 mg via ORAL
  Filled 2013-08-27: qty 1

## 2013-08-27 MED ORDER — ALBUTEROL SULFATE (2.5 MG/3ML) 0.083% IN NEBU
2.5000 mg | INHALATION_SOLUTION | Freq: Once | RESPIRATORY_TRACT | Status: AC
  Administered 2013-08-27: 2.5 mg via RESPIRATORY_TRACT
  Filled 2013-08-27: qty 3

## 2013-08-27 NOTE — Progress Notes (Signed)
Echocardiogram and chest x-ray completed, spoke with Dr. Broadus John regarding results.  Awaiting new orders regarding antibiotic related to chest x-ray results.  Nursing will continue to monitor.

## 2013-08-27 NOTE — Consult Note (Addendum)
[facility] Medical Consultation  MEMORY Lindsey Weber:[id number] DOB: 05-04-1941 DOA: 08/26/2013 PCP: [name], MD   Requesting physician: Orthopedics  Date of consultation: 2/4 Reason for consultation: Medical management  Impression/Recommendations 1. right tibial fracture -Per orthopedics -DVT prophylaxis -Cardiac risk: Intermediate,  -murmur noted will FU 2-D echocardiogram  2. suspected COPD/long-standing tobacco abuse -Mild wheezing noted at this time -Albuterol when necessary for now -Check two-view chest x-ray -Counseled  3. diabetes mellitus -Hold metformin, sliding scale insulin -Check HbA1c  4. renal insufficiency -Likely chronic no baseline labs, await nephrotoxic agents -UA with proteinuria  5. carotid artery disease -Start ASA, continue statin -FU with vascular  6. Hypertension -Stable, continue amlodipine  Thank you for the consult we will followup  Lindsey Polite, MD [phone]  Chief Complaint: Fall/pain right leg  HPI:  Lindsey Weber is a 73 year old white female history of diabetes, hypertension, suspected COPD/long-standing tobacco use, dyslipidemia was admitted to the orthopedic service last night following a fall and complex right tibial plateau and shaft fracture. Hospitalists were consulted for medical management. Patient denies any specific symptoms at this time, denies any history of cardiac problems, denies chest pain or shortness of breath at rest or with exertion. She has noticed occasional wheezing, has cut down her smoking to 5 cigarettes a day now. At this point awaiting surgery on Friday  Review of Systems:  12 system review negative except for occasional wheezing  Past Medical History  Diagnosis Date  . Diabetes mellitus without complication   . Hypertension   . Hyperlipemia   . COPD (chronic obstructive pulmonary disease)   . Carotid stenosis, bilateral   . Peripheral vascular disease    Past Surgical History   Procedure Laterality Date  . Bladder tack    . Abdominal hysterectomy      PARTIAL   Social History:  reports that she has been smoking Cigarettes.  She has been smoking about 0.50 packs per day. She has never used smokeless tobacco. She reports that she does not drink alcohol or use illicit drugs.  No Known Allergies History reviewed. No pertinent family history.  Prior to Admission medications   Medication Sig Start Date End Date Taking? Authorizing Provider  ALPRAZolam Duanne Moron) 0.5 MG tablet Take 0.5 mg by mouth as needed for anxiety.   Yes Historical Provider, MD  amLODipine (NORVASC) 2.5 MG tablet Take 2.5 mg by mouth daily.   Yes Historical Provider, MD  aspirin 325 MG tablet Take 325 mg by mouth daily.   Yes Historical Provider, MD  calcium carbonate (OS-CAL) 600 MG TABS tablet Take 600 mg by mouth 2 (two) times daily with a meal.   Yes Historical Provider, MD  Coenzyme Q10 (CO Q 10) 100 MG CAPS Take 1 capsule by mouth daily.   Yes Historical Provider, MD  cyanocobalamin 1000 MCG tablet Take 100 mcg by mouth daily.   Yes Historical Provider, MD  cyanocobalamin 500 MCG tablet Take 500 mcg by mouth daily.   Yes Historical Provider, MD  metFORMIN (GLUCOPHAGE) 500 MG tablet Take 500 mg by mouth 2 (two) times daily with a meal.   Yes Historical Provider, MD  Multiple Vitamins-Minerals (MULTIVITAMIN WITH MINERALS) tablet Take 1 tablet by mouth daily.   Yes Historical Provider, MD  omega-3 acid ethyl esters (LOVAZA) 1 G capsule Take 1 g by mouth daily.   Yes Historical Provider, MD  simvastatin (ZOCOR) 5 MG tablet Take 5 mg by mouth daily.   Yes Historical Provider, MD   Physical Exam:  Blood pressure 142/45, pulse 86, temperature 99 F (37.2 C), temperature source Oral, resp. rate 18, height 5\' 8"  (1.727 m), weight 70.308 kg (155 lb), SpO2 95.00%. Filed Vitals:   08/27/13 1000  BP: 142/45  Pulse:   Temp:   Resp:      General:  Alert awake oriented x3 no distress  HEENT: PERRLA,  EOMI,  CVS S1-S2 regular rate rhythm pansystolic murmur noted  Lungs: Scattered expiratory wheezes noted and occasional rhonchi  Abdomen soft nontender nondistended positive bowel sounds no organomegaly  Extremities: Right lower leg immobilized, no edema clubbing or cyanosis 2+ peripheral pulses  Skin no rashes extensive gastric appropriate mood and affect  Neuro nonfocal  Labs on Admission:  Basic Metabolic Panel:  Recent Labs Lab 08/26/13 1005 08/26/13 1245 08/27/13 1120  NA 137 137 139  K 4.3 4.5 5.1  CL 98 100 103  CO2 24 22 23   GLUCOSE 174* 142* 137*  BUN 21 23 30*  CREATININE 1.65* 1.79* 2.17*  CALCIUM 9.1 8.7 8.1*  PHOS  --   --  3.7   Liver Function Tests:  Recent Labs Lab 08/26/13 1245 08/27/13 1120  AST 17  --   ALT 14  --   ALKPHOS 74  --   BILITOT 0.3  --   PROT 6.4  --   ALBUMIN 3.1* 3.0*   No results found for this basename: LIPASE, AMYLASE,  in the last 168 hours No results found for this basename: AMMONIA,  in the last 168 hours CBC:  Recent Labs Lab 08/26/13 1005 08/26/13 1245  WBC 12.8* 14.8*  NEUTROABS 10.6*  --   HGB 11.8* 11.1*  HCT 34.1* 31.8*  MCV 85.3 85.0  PLT 376 393   Cardiac Enzymes: No results found for this basename: CKTOTAL, CKMB, CKMBINDEX, TROPONINI,  in the last 168 hours BNP: No components found with this basename: POCBNP,  CBG:  Recent Labs Lab 08/26/13 1452 08/26/13 1833 08/27/13 0703 08/27/13 1126 08/27/13 1254  GLUCAP 140* 148* 133* 133* 136*    Radiological Exams on Admission: Dg Chest 1 View  08/26/2013   CLINICAL DATA:  Fall today, preop, no chest complaints  EXAM: CHEST - 1 VIEW  COMPARISON:  03/09/2006  FINDINGS: Mild background interstitial changes stable. Heart size and vascular pattern are normal. Lungs are clear. No effusion or pneumothorax.  IMPRESSION: No active disease.   Electronically Signed   By: Skipper Cliche M.D.   On: 08/26/2013 10:49   Dg Hip Complete Right  08/26/2013   CLINICAL  DATA:  Fall today with pain and upper tibia and fibula  EXAM: RIGHT HIP - COMPLETE 2+ VIEW  COMPARISON:  None.  FINDINGS: There is no evidence of hip fracture or dislocation. There is no evidence of arthropathy or other focal bone abnormality.  IMPRESSION: Negative.   Electronically Signed   By: Skipper Cliche M.D.   On: 08/26/2013 11:07   Dg Knee 2 Views Right  08/26/2013   CLINICAL DATA:  Pain post fall  EXAM: RIGHT KNEE - 1-2 VIEW  COMPARISON:  None  FINDINGS: Osseous demineralization.  Markedly comminuted and displaced proximal right tibial meta diaphyseal fracture.  Probable extension of fracture planes into lateral compartment.  Femur and patella appear grossly intact.  Large knee joint effusion.  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  Displaced fibular neck fracture.  IMPRESSION: Comminuted proximal right tibial meta diaphyseal fracture with probable intra-articular extension at lateral compartment.  Displaced right fibular neck  fracture.  Marked osseous demineralization with degenerative changes and large joint effusion.   Electronically Signed   By: Lavonia Dana M.D.   On: 08/26/2013 10:53   Dg Tibia/fibula Right  08/26/2013   CLINICAL DATA:  Fall today, pain in upper right tibia and fibula  EXAM: RIGHT TIBIA AND FIBULA - 2 VIEW  COMPARISON:  None.  FINDINGS: There is a mildly displaced significantly comminuted fracture of the proximal tibia. This extends from the central tibial plateau through the tibial metaphysis and into the proximal third of the tibial shaft. There is also a mildly comminuted and mildly displaced fracture of the neck of the fibula.  Incidental note is made of popliteal vascular calcification and of nonspecific subcutaneous calcification anterior inferior to the patella. There appears to be a small knee joint effusion.  IMPRESSION: Comminuted mildly displaced fractures of the proximal tibia and fibula   Electronically Signed   By:  Skipper Cliche M.D.   On: 08/26/2013 11:06   Ct Tibia Fibula Right Wo Contrast  08/26/2013   CLINICAL DATA:  Patient fell down stairs.  Right leg pain.  EXAM: CT OF THE RIGHT TIBIA FIBULA WITHOUT CONTRAST  TECHNIQUE: Multidetector CT imaging of the bilateral lower extremities was performed according to the standard protocol. Multiplanar CT image reconstructions were also generated.  COMPARISON:  None.  FINDINGS: There is a comminuted fracture of the medial and lateral tibial plateaus and involving the tibial spine. There is minimal depression of the medial aspect of the lateral tibial plateau measuring 2 mm. There is no significant depression of the medial tibial plateau.  There is a comminuted fracture of the proximal tibial metaphysis which extends to the inferior aspect of the tibial tuberosity at the patellar tendon insertion. There is a comminuted fracture extending into the proximal and mid tibial diaphysis with a butterfly fragment posteriorly.  There is no knee dislocation. There is a lipohemarthrosis. There are tricompartmental osteoarthritic changes most severe in the lateral femoral tibial compartment.  There is a comminuted fracture of the proximal fibular head and metaphysis without significant displacement or angulation. The fracture extends to the articular surface without disruption of the proximal tibiofibular joint.  The soft tissues are normal. There is no soft tissue mass. There is soft tissue edema along the anteromedial aspect of the right lower leg. There is peripheral vascular atherosclerotic disease.  IMPRESSION: 1. Comminuted fracture involving the medial and lateral tibial plateaus as well as the tibial spine extending into the proximal tibial metaphysis and into the proximal -mid tibial diaphysis as described above. 2. Comminuted fracture of the proximal fibular head.   Electronically Signed   By: Kathreen Devoid   On: 08/26/2013 12:42    EKG: Independently reviewed.  Normal sinus  rhythm nonspecific T wave changes  Time spent: 66min  [facility] Pager [phone]  If 7PM-7AM, please contact night-coverage [email or web address] Password [email or web address] 08/27/2013, 1:34 PM

## 2013-08-27 NOTE — Progress Notes (Signed)
  Echocardiogram 2D Echocardiogram has been performed.  Diamond Nickel 08/27/2013, 12:35 PM

## 2013-08-27 NOTE — Progress Notes (Signed)
EKG completed, results called to Ainsley Spinner, PA, consult has been placed to internal medicine, will be up to see patient this afternoon.

## 2013-08-27 NOTE — Progress Notes (Signed)
Patient had 1 episode of vomiting late last night.. Pt received 4 mg Zofran in [facility] ED at [place] but still felt very nauseous and vomited once upon arrival. Pt stated that she thinks the morphine 4mg  is causing her N/V. PA-C on call ordered scheduled Zofran 4mg  for 10pm. He okayed the scheduled Zofran dose administration at 9pm. Pt felt relief post Zofran administration. PA-C on call also ordered Dilaudid 0.5 mg IV q3h PRN. Patient has received one dose and appears to be tolerating well. Pt states that her pain is also relieved and would like to continue with the Dilaudid.

## 2013-08-27 NOTE — Consult Note (Signed)
Orthopaedic Trauma Service consult  Reason: complex R tibial plateau fracture and R tibial shaft fracture Requesting: [name], MD (Ortho)  Pt seen and evaluated with [name]  PCP: [name], MD  HPI:    Patient is a 73 year old white female, with history of hypertension, diabetes, hyperlipidemia who sustained a fall at home yesterday afternoon while going downstairs. She lost her footing in her right leg went under her. She had immediate onset of pain in her right leg with deformity but was able to get up stairs. She was brought to [facility] for evaluation where she was found to have a complex right tibial plateau and tibial shaft fracture. Patient was initially seen and evaluated by Dr. Rolena Infante of orthopedics but given the complexity of the injury the orthopedic trauma service was consult and regarding her injury. Patient was eventually transferred over to [place] about 10:00 last night. The orthopedic trauma services consultation on her this morning.  Currently patient is in 5 N. room to complains of some right leg pain and nausea and vomiting. Denies any pain elsewhere. Pain is exacerbated with movement. It is relieved with rest elevation and pain medicine. She does report some numbness to the dorsum of her right foot but is able to move her toes without increased pain or difficulty. Patient denies any additional injuries.  Patient is ambulatory without any assistive devices prior to this fall.  Past medical history  Hypertension  Hyperlipidemia  Diabetes , On metformin  Vascular disease, Carotid ultrasound 2014 showed bilateral carotid artery stenosis with stenosis more pronounced in the right side approximately 50-69% occlusion, do not appreciate the patient has been evaluated by vascular surgery  Past surgical history  Partial hysterectomy  Bladder tacking  Family history  Noncontributory  Social history  Patient is a current everyday smoker smokes about  half pack per day has done so since the age of 34  No alcohol use  Patient lives in [place]  No assistive devices prior to this acute injury, ambulatory without any difficulties  Medications prior to admission  Aspirin, Xanax, Norvasc, Os-Cal, vitamin B12, coenzyme Q10, multivitamin, metformin, omega-3's, simvastatin  Allergies  No known drug allergies  Review of Systems  Constitutional: Negative for fever and chills.  Eyes: Negative for blurred vision.  Respiratory: Negative for shortness of breath.   Cardiovascular: Negative for chest pain and palpitations.  Gastrointestinal: Positive for nausea and vomiting. Negative for abdominal pain.  Genitourinary: Negative for dysuria.  Musculoskeletal:       Right leg pain  Neurological: Positive for sensory change. Negative for headaches.    Physical exam  BP 151/49  Pulse 86  Temp(Src) 99 F (37.2 C) (Oral)  Resp 18  SpO2 95%   Physical Exam  Constitutional: She is oriented to person, place, and time. Vital signs are normal. She appears well-developed and well-nourished. She is cooperative.  Pleasant, answers all questions appropriately  HENT:  Head: Normocephalic and atraumatic.  Mouth/Throat: Mucous membranes are dry.  Neck: Normal range of motion and full passive range of motion without pain. Neck supple.  Cardiovascular:  S1 and S2, systolic murmur appreciated  Pulmonary/Chest:  Wheezing bilaterally, right greater than left  Abdominal:  Soft, nontender, nondistended No palpable masses appreciated + Bowel sounds  Musculoskeletal:  Pelvis    No instability or pain with AP or lateral compression  Bilateral upper extremities   No acute findings  Motor and sensory functions grossly intact   Palpable peripheral pulses  Full active and passive range of motion of all joints     Left lower extremity    No acute findings    Full Range of motion the hip, knee, ankle and foot   Motor and sensory functions intact     Peripheral pulses are difficult to ascertain but I am able to palpate a dorsalis pedis, 1+. Unable to appreciate posterior tibialis  Right lower extremity    Hip is unremarkable, no pain with axial loading or gentle logrolling.   Patient is in a long leg posterior splint that extends from mid thigh to the foot. There is a very nice bulky dressing and Ace wrap applied to the lower extremity.   The dressing was split partially to evaluate her soft tissue at her foot and ankle as well as proximally at the tibial plateau region.    There is fairly substantial swelling present. Skin does not wrinkles with gentle compression on the area of the tibial plateau. There also some fracture blisters present in the area that was examined.    Moderate swelling is noted distally to the foot as well.    Unable to appreciate dorsalis pedis or posterior tibial pulses but her extremities warm with satisfactory capillary refill   Decreased deep peroneal nerve sensory function   SPN and TN sensory functions are grossly intact   EHL, FHL and lesser toe motor functions are intact.   Compartments are full but compressible without pain. No pain with passive stretching   Ligamentous exam of the knee and ankle not performed due to splinting    Neurological: She is alert and oriented to person, place, and time.  Skin: Skin is warm and intact.  Psychiatric: She has a normal mood and affect. Her speech is normal and behavior is normal. Thought content normal.     Labs  Labs are from 08/26/2013  Results for PICCOLA, ARICO (MRN [id number]) as of 08/27/2013 09:45  Ref. Range 08/26/2013 13:08  Color, Urine Latest Range: YELLOW  YELLOW  APPearance Latest Range: CLEAR  CLOUDY (A)  Specific Gravity, Urine Latest Range: 1.005-1.030  1.015  pH Latest Range: 5.0-8.0  5.5  Glucose Latest Range: NEGATIVE mg/dL NEGATIVE  Bilirubin Urine Latest Range: NEGATIVE  NEGATIVE  Ketones, ur Latest Range: NEGATIVE mg/dL NEGATIVE  Protein  Latest Range: NEGATIVE mg/dL >300 (A)  Urobilinogen, UA Latest Range: 0.0-1.0 mg/dL 0.2  Nitrite Latest Range: NEGATIVE  NEGATIVE  Leukocytes, UA Latest Range: NEGATIVE  NEGATIVE  Hgb urine dipstick Latest Range: NEGATIVE  NEGATIVE  Urine-Other No range found AMORPHOUS URATES/PHOSPHATES  WBC, UA Latest Range: <3 WBC/hpf 0-2  Squamous Epithelial / LPF Latest Range: RARE  RARE  Bacteria, UA Latest Range: RARE  RARE  Casts Latest Range: NEGATIVE  GRANULAR CAST (A)    Results for TINNA, KOLKER (MRN [id number]) as of 08/27/2013 09:45  Ref. Range 08/26/2013 12:45  Sodium Latest Range: 137-147 mEq/L 137  Potassium Latest Range: 3.7-5.3 mEq/L 4.5  Chloride Latest Range: 96-112 mEq/L 100  CO2 Latest Range: 19-32 mEq/L 22  BUN Latest Range: 6-23 mg/dL 23  Creatinine Latest Range: 0.50-1.10 mg/dL 1.79 (H)  Calcium Latest Range: 8.4-10.5 mg/dL 8.7  GFR calc non Af Amer Latest Range: >90 mL/min 27 (L)  GFR calc Af Amer Latest Range: >90 mL/min 31 (L)  Glucose Latest Range: 70-99 mg/dL 142 (H)  Alkaline Phosphatase Latest Range: 39-117 U/L 74  Albumin Latest Range: 3.5-5.2 g/dL 3.1 (L)  AST Latest Range: 0-37 U/L 17  ALT Latest Range: 0-35 U/L 14  Total Protein Latest Range: 6.0-8.3 g/dL 6.4  Total Bilirubin Latest Range: 0.3-1.2 mg/dL 0.3  WBC Latest Range: 4.0-10.5 K/uL 14.8 (H)  RBC Latest Range: 3.87-5.11 MIL/uL 3.74 (L)  Hemoglobin Latest Range: 12.0-15.0 g/dL 11.1 (L)  HCT Latest Range: 36.0-46.0 % 31.8 (L)  MCV Latest Range: 78.0-100.0 fL 85.0  MCH Latest Range: 26.0-34.0 pg 29.7  MCHC Latest Range: 30.0-36.0 g/dL 34.9  RDW Latest Range: 11.5-15.5 % 12.8  Platelets Latest Range: 150-400 K/uL 393  Prothrombin Time Latest Range: 11.6-15.2 seconds 12.4  INR Latest Range: 0.00-1.49  0.94  APTT Latest Range: 24-37 seconds 32    Imaging  Plain films and CT scan right tibia and knee  Complex right tibial shaft and tibial plateau fractures. Mild joint comminution present but joint  surface itself looks well maintained and preserved. There is extensive comminution through the metadiaphyseal region as well as the diaphysis of the right tibial shaft. There is also a coronal fragment of the tibial plateau as well with some mild displacement.    Assessment and plan    73 year old white female status post fall with complex right tibial shaft and tibial plateau fractures.    1. Complex right tibial plateau fracture with right tibial shaft fracture  This is quite a remarkable fracture from such a low energy mechanism. Patient will need to have some type of workup for her bone density. This will take place during this hospitalization and will likely continue as an outpatient. She would benefit from a DEXA scan as an outpatient as well.  Patient will need surgical intervention to address this fracture. Patient ideally would benefit from a bridge plating technique however we do think that early surgical intervention is most appropriate for this patient and can consider IM nailing. Her swelling at this point with acute any type of plating and even at its current state patient will still require another 2 days of soft tissue rest with aggressive ice and elevation to get some of her swelling to subside before proceeding to the OR for intramedullary nailing.   Patient will be nonweightbearing for 6-8 weeks postoperatively again the knee joint involvement as well as extensive comminution of her shaft. She'll have unrestricted range of motion of her right knee postoperatively as well.   For the time being patient can be nonweightbearing with therapies and transfer to and from her chair as necessary.   Continue with aggressive ice and elevation to level the heart to help with swelling control and resolution   Patient is encouraged to move her toes as much possible to help with swelling control as well   2. wheezing right> left  Xopenex inhaler times one now and will schedule one to 2 puffs  every 6 hours as needed  Continue with incentive spirometry  3. systolic murmur  Will order an echo and this needs to be completed before we can proceed to the OR  Stat EKG as well  4. elevated creatinine/proteinuria  Patient was noted to have some protein in her urine and wonder if this is related to chronic kidney insufficiency related to her diabetes and vascular disease. Elevated creatinine can also be do to some dehydration and her metformin use. We will discontinue her metformin for now and continue on SSI for diabetes control   will continue with gentle IV hydration  Repeat renal function panel today  5. chronic medical issues including hypertension, hyperlipidemia, and diabetes  Continue Norvasc and simvastatin  Hold metformin as noted above  SSI and carbohydrate modified diet  6. DVT and PE prophylaxis  Lovenox for now  Discontinue aspirin  SCD to left leg  7. Pain  Continue with IV Dilaudid for severe pain  Will add oral Norco 5-325, one to 2 every 6 hours as needed for pain  Robaxin for muscle spasms  8. nicotine dependence  No nicotine products during her healing phase secondary to negative effects on wound and bone healing   9. Disposition  Continue with admission for observation and pain control and OR on Friday for ORIF versus IM nail of her right tibial plateau and tibial shaft fractures.   Clinically I do feel that the patient is a somewhat sicker than what she leads on to be given the presence of elevated creatinine, wheezing, systolic murmur (which patient has never been informed of before in the past ), carotid artery stenosis and other peripheral vascular disease as evidenced by calcifications noted on plain x-rays . With all of these factors present We'll obtain a internal medicine consult so that they can follow and assist with management of this patient  Jari Pigg, PA-C [facility] [phone] (P) 08/27/2013 10:25 AM

## 2013-08-27 NOTE — Progress Notes (Signed)
Orthopedic Tech Progress Note Patient Details:  Lindsey Weber 1940-12-03 [id number]  Patient ID: Lindsey Weber, female   DOB: 1940/10/16, 73 y.o.   MRN: [id number] Trapeze bar patient helper  Hildred Priest 08/27/2013, 1:50 PM

## 2013-08-28 ENCOUNTER — Encounter (HOSPITAL_COMMUNITY)
Admission: EM | Disposition: A | Discharge status: Home Health | Payer: Self-pay | Source: Home / Self Care | Attending: Orthopedic Surgery

## 2013-08-28 ENCOUNTER — Encounter (HOSPITAL_COMMUNITY): Payer: Self-pay

## 2013-08-28 DIAGNOSIS — I517 Cardiomegaly: Secondary | ICD-10-CM

## 2013-08-28 HISTORY — PX: TEE WITHOUT CARDIOVERSION: SHX5443

## 2013-08-28 LAB — CBC
HEMATOCRIT: 23 % — AB (ref 36.0–46.0)
HEMOGLOBIN: 7.8 g/dL — AB (ref 12.0–15.0)
MCH: 29.2 pg (ref 26.0–34.0)
MCHC: 33.9 g/dL (ref 30.0–36.0)
MCV: 86.1 fL (ref 78.0–100.0)
Platelets: 275 10*3/uL (ref 150–400)
RBC: 2.67 MIL/uL — ABNORMAL LOW (ref 3.87–5.11)
RDW: 13.3 % (ref 11.5–15.5)
WBC: 10.4 10*3/uL (ref 4.0–10.5)

## 2013-08-28 LAB — PREPARE RBC (CROSSMATCH)

## 2013-08-28 LAB — BASIC METABOLIC PANEL
BUN: 24 mg/dL — ABNORMAL HIGH (ref 6–23)
CHLORIDE: 103 meq/L (ref 96–112)
CO2: 22 meq/L (ref 19–32)
CREATININE: 1.79 mg/dL — AB (ref 0.50–1.10)
Calcium: 7.9 mg/dL — ABNORMAL LOW (ref 8.4–10.5)
GFR calc Af Amer: 31 mL/min — ABNORMAL LOW (ref >90)
GFR calc non Af Amer: 27 mL/min — ABNORMAL LOW (ref >90)
Glucose, Bld: 133 mg/dL — ABNORMAL HIGH (ref 70–99)
Potassium: 4.3 mEq/L (ref 3.7–5.3)
Sodium: 138 mEq/L (ref 137–147)

## 2013-08-28 LAB — GLUCOSE, CAPILLARY
GLUCOSE-CAPILLARY: 131 mg/dL — AB (ref 70–99)
Glucose-Capillary: 122 mg/dL — ABNORMAL HIGH (ref 70–99)
Glucose-Capillary: 110 mg/dL — ABNORMAL HIGH (ref 70–99)
Glucose-Capillary: 91 mg/dL (ref 70–99)
Glucose-Capillary: 99 mg/dL (ref 70–99)

## 2013-08-28 LAB — ABO/RH: ABO/RH(D): O POS

## 2013-08-28 LAB — VITAMIN D 25 HYDROXY (VIT D DEFICIENCY, FRACTURES): Vit D, 25-Hydroxy: 36 ng/mL (ref 30–89)

## 2013-08-28 SURGERY — ECHOCARDIOGRAM, TRANSESOPHAGEAL
Anesthesia: Moderate Sedation

## 2013-08-28 MED ORDER — DIPHENHYDRAMINE HCL 25 MG PO CAPS
25.0000 mg | ORAL_CAPSULE | Freq: Once | ORAL | Status: AC
  Administered 2013-08-28: 25 mg via ORAL
  Filled 2013-08-28: qty 1

## 2013-08-28 MED ORDER — MIDAZOLAM HCL 5 MG/ML IJ SOLN
INTRAMUSCULAR | Status: AC
  Filled 2013-08-28: qty 2

## 2013-08-28 MED ORDER — HYDRALAZINE HCL 20 MG/ML IJ SOLN
10.0000 mg | Freq: Four times a day (QID) | INTRAMUSCULAR | Status: DC | PRN
  Administered 2013-08-29: 10 mg via INTRAVENOUS
  Filled 2013-08-28: qty 1

## 2013-08-28 MED ORDER — SODIUM CHLORIDE 0.9 % IV SOLN: INTRAVENOUS | Status: DC

## 2013-08-28 MED ORDER — ACETAMINOPHEN 325 MG PO TABS
650.0000 mg | ORAL_TABLET | Freq: Once | ORAL | Status: AC
  Administered 2013-08-28: 650 mg via ORAL
  Filled 2013-08-28: qty 2

## 2013-08-28 MED ORDER — FENTANYL CITRATE 0.05 MG/ML IJ SOLN
INTRAMUSCULAR | Status: DC | PRN
  Administered 2013-08-28 (×2): 25 ug via INTRAVENOUS

## 2013-08-28 MED ORDER — MIDAZOLAM HCL 10 MG/2ML IJ SOLN
INTRAMUSCULAR | Status: DC | PRN
  Administered 2013-08-28: 1 mg via INTRAVENOUS
  Administered 2013-08-28 (×2): 2 mg via INTRAVENOUS

## 2013-08-28 MED ORDER — BUTAMBEN-TETRACAINE-BENZOCAINE 2-2-14 % EX AERO
INHALATION_SPRAY | CUTANEOUS | Status: DC | PRN
  Administered 2013-08-28: 2 via TOPICAL

## 2013-08-28 MED ORDER — FENTANYL CITRATE 0.05 MG/ML IJ SOLN
INTRAMUSCULAR | Status: AC
  Filled 2013-08-28: qty 2

## 2013-08-28 MED ORDER — FUROSEMIDE 10 MG/ML IJ SOLN
20.0000 mg | Freq: Once | INTRAMUSCULAR | Status: AC
  Administered 2013-08-28: 20 mg via INTRAVENOUS
  Filled 2013-08-28: qty 2

## 2013-08-28 MED ORDER — FUROSEMIDE 10 MG/ML IJ SOLN
20.0000 mg | Freq: Once | INTRAMUSCULAR | Status: AC
  Administered 2013-08-29: 20 mg via INTRAVENOUS
  Filled 2013-08-28: qty 2

## 2013-08-28 MED ORDER — AMLODIPINE BESYLATE 5 MG PO TABS
5.0000 mg | ORAL_TABLET | Freq: Every day | ORAL | Status: DC
  Filled 2013-08-28: qty 1

## 2013-08-28 NOTE — Progress Notes (Signed)
Orthopedic Tech Progress Note Patient Details:  [name] Jul 06, 1941 [id number] Supplies for splinting placed in patient's room (5N2). Nurse called for 6 or 7 6' rolls of plaster, 4 4' webril, and 4 6' webril. Nurse stated she could supply ace wraps from unit store room. Supplies delivered to room. Patient not present (in surgery).  Patient ID: Lindsey Weber, female   DOB: 11/11/1940, 73 y.o.   MRN: [id number]   Fenton Foy 08/28/2013, 1:22 PM

## 2013-08-28 NOTE — Progress Notes (Signed)
Echocardiogram Echocardiogram Transesophageal has been performed.  Joelene Millin 08/28/2013, 1:49 PM

## 2013-08-28 NOTE — CV Procedure (Signed)
     Transesophageal Echocardiogram Note  Lindsey Weber [id number] December 27, 1940  Procedure: Transesophageal Echocardiogram Indications: Possible right atrial mass on TTE  Procedure Details Consent: Obtained Time Out: Verified patient identification, verified procedure, site/side was marked, verified correct patient position, special equipment/implants available, Radiology Safety Procedures followed,  medications/allergies/relevent history reviewed, required imaging and test results available.  Performed  Medications: Fentanyl: 25 mcg Versed: 4 mg  Left Ventrical:  The cavity size was normal. Wall thickness was increased in a pattern of mild LVH. There was mild focal basal hypertrophy of the septum. Systolic function was vigorous. The estimated ejection fraction was in the range of 65% to 70%. Wall motion was normal; there were no regional wall motion abnormalities.  Mitral Valve: Mild MAC, no MR  Aortic Valve: No AI  Tricuspid Valve: Normal, no TR  Pulmonic Valve: No PR  Left Atrium/ Left atrial appendage: Mild LA enlargement, no thrombus in LA/LAA  Atrial septum: There is prominent lipomatous hypertrophy of the interatrial septum.   Right atrium: No mass identified in the RA. The structure seen on TTE is just prominent lipomatous hypetrophy of the interatrial septum.   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  Ena Dawley, Lemmie Evens, MD, [facility] 08/28/2013, 11:42 AM     - Mitral valve: Calcified annulus. - Left atrium: The atrium was mildly dilated. Impressions:  - Possible RA mass apical 4 chamber view; suggest TEE or cardiac MRI to further assess.

## 2013-08-28 NOTE — Progress Notes (Signed)
Asked to see pt re: need for TEE based on echo finding of possible RA mass.  Study Conclusions  - Left ventricle: The cavity size was normal. Wall thickness was increased in a pattern of mild LVH. There was mild focal basal hypertrophy of the septum. Systolic function was vigorous. The estimated ejection fraction was in the range of 65% to 70%. Wall motion was normal; there were no regional wall motion abnormalities. Doppler parameters are consistent with abnormal left ventricular relaxation (grade 1 diastolic dysfunction). - Aortic valve: There was mild stenosis. Valve area: 1.69cm^2(VTI). Valve area: 1.37cm^2 (Vmax). Valve area: 1.46cm^2 (Vmean). - Mitral valve: Calcified annulus. - Left atrium: The atrium was mildly dilated. - Possible RA mass apical 4 chamber view; suggest TEE or cardiac MRI to further assess.  After careful review of history and examination, the risks and benefits of transesophageal echocardiogram have been explained including risks of esophageal damage, perforation (1:10,000 risk), bleeding, pharyngeal hematoma as well as other potential complications associated with conscious sedation including aspiration, arrhythmia, respiratory failure and death. Alternatives to treatment were discussed, questions were answered. Patient is willing to proceed.   Murray Hodgkins, NP 08/28/2013 10:52 AM

## 2013-08-28 NOTE — Progress Notes (Signed)
[facility] Progress Note  Subjective  No complaints whatsoever this am Pain controlled in R leg Worked with PT yesterday Denies chest pain and lightheadedness  Echo yesterday  LV EF 65-70%  Mild LVH  No wall motion abnormalities  Grade 1 diastolic dysfunction  Mild AV stenosis     Possible RA mass    Review of Systems  Constitutional: Negative for fever and chills.  Eyes: Negative for blurred vision.  Respiratory: Negative for shortness of breath.   Cardiovascular: Negative for chest pain and palpitations.  Gastrointestinal: Negative for nausea, vomiting and abdominal pain.  Neurological: Positive for sensory change. Negative for headaches.       Right foot      Objective   BP 151/40  Pulse 98  Temp(Src) 98.2 F (36.8 C) (Oral)  Resp 18  Ht 5\' 8"  (1.727 m)  Wt 70.308 kg (155 lb)  BMI 23.57 kg/m2  SpO2 97%  Intake/Output     02/04 0701 - 02/05 0700 02/05 0701 - 02/06 0700   P.O. 420    I.V. (mL/kg) 1100 (15.6)    Total Intake(mL/kg) 1520 (21.6)    Urine (mL/kg/hr) 3850 (2.3)    Total Output 3850     Net -2330            Labs  Results for Lindsey, Weber (MRN [id number]) as of 08/28/2013 10:24  Ref. Range 08/28/2013 05:40  Sodium Latest Range: 137-147 mEq/L 138  Potassium Latest Range: 3.7-5.3 mEq/L 4.3  Chloride Latest Range: 96-112 mEq/L 103  CO2 Latest Range: 19-32 mEq/L 22  BUN Latest Range: 6-23 mg/dL 24 (H)  Creatinine Latest Range: 0.50-1.10 mg/dL 1.79 (H)  Calcium Latest Range: 8.4-10.5 mg/dL 7.9 (L)  GFR calc non Af Amer Latest Range: >90 mL/min 27 (L)  GFR calc Af Amer Latest Range: >90 mL/min 31 (L)  Glucose Latest Range: 70-99 mg/dL 133 (H)  WBC Latest Range: 4.0-10.5 K/uL 10.4  RBC Latest Range: 3.87-5.11 MIL/uL 2.67 (L)  Hemoglobin Latest Range: 12.0-15.0 g/dL 7.8 (L)  HCT Latest Range: 36.0-46.0 % 23.0 (L)  MCV Latest Range: 78.0-100.0 fL 86.1  MCH Latest Range: 26.0-34.0 pg 29.2  MCHC Latest Range: 30.0-36.0 g/dL  33.9  RDW Latest Range: 11.5-15.5 % 13.3  Platelets Latest Range: 150-400 K/uL 275    Exam  Gen: awake and alert, NAD, resting comfortably in bed Lungs: occ wheezes,  Cardiac: s1 and s2, + systolic murmur Abd: + BS, NTND Ext:       Right Lower Extremity   Splint and dressing intact  eval'd soft tissue again, still with moderate swelling, large blister laterally in area of anticipated incision   Dec sensation DPN, SPN, TN  EHL, FHL, lesser toe motor intact  Still unable to appreciate DP, PT pulses  Ext is warm  No pain with passive stretch   Compartments full but nontender      Assessment and Plan   POD/HD#: 63     73 year old white female status post fall with complex right tibial shaft and tibial plateau fractures.              1. Complex right tibial plateau fracture with right tibial shaft fracture        Substantial swelling present  Fracture blister in zone of incision  Pt definitely needs plate fixation to address her fracture but soft tissue swelling will prevent early intervention  Will resplint tomorrow  Pt may need to dc to snf for assistance  We will  recheck soft tissue in a few days and plan for OR 10-14 days for definitive fixation  Continue with ice and elevation   PT/OT as tolerated  NWB R leg   2. wheezing right> left           continue with PRN albuterol    3. systolic murmur- Aortic stenosis, ? RA mass on TTE  TEE ordered to better eval              4. elevated creatinine/proteinuria           Cr slightly improved this am  Continue to hold metformin   Continue with hydration   5. chronic medical issues including hypertension, hyperlipidemia, and diabetes           Norvac held due to low BP  Wide pulse pressue likely related to atherosclerotic disease           Hold metformin as noted above           SSI and carbohydrate modified diet  6. DVT and PE prophylaxis           Lovenox for now           low dose asa therapy ok            SCD to  left leg  7. Pain           Continue with IV Dilaudid for severe pain           Will add oral Norco 5-325, one to 2 every 6 hours as needed for pain           Robaxin for muscle spasms  8. nicotine dependence           No nicotine products during her healing phase secondary to negative effects on wound and bone healing      9. ABL anemia  Will transfuse 2 units PRBC's today  Blood loss likely related to complex fx with concomitant ASA use  10. FEN  Advance to CHO mod diet after TEE  Continue with IVF        10. Disposition           No OR tomorrow  Will resplint on floor tomorrow  TEE today- will likely need cards eval   SW consult for SNF     Jari Pigg, PA-C [facility] [phone] (P) 08/28/2013 10:23 AM

## 2013-08-28 NOTE — H&P (View-Only) (Deleted)
[facility] PROGRESS NOTE  Lindsey Weber [id number]:[id number] DOB: [birth year] DOA: 08/26/2013 PCP: [name], MD  Assessment/Plan: 73 year old white female history of diabetes, hypertension, suspected COPD/long-standing tobacco use, dyslipidemia was admitted to the orthopedic service last night following a fall and complex right tibial plateau and shaft fracture.  Hospitalists were consulted for medical management.  1. Right tibial fracture; Per orthopedics -preop 2-D echocardiogram: ? RA mass; pend TEE;   2. Suspected COPD/long-standing tobacco abuse; bronchitis; CXR: no clear infiltrate; but fever 2/4 -cont prn bronchodilators, levofloxacin added; stop smoking; need OP PFTs   3. Diabetes mellitus; HA1C-7.1; d/c metformin with CKD, cont sliding scale insulin inpatient, glipizide upon discharge   4. CKD II, Likely chronic no baseline labs, await nephrotoxic agents; UA with proteinuria   5. Carotid artery disease R; Start ASA, continue statin; FU with vascular   6. Hypertension increase amlodipine   7. Chronic CHF; echo:LVEF 75%, grade 1 diastolic dysfunction, AS with AVA 1.3, gradient 16 -noted lasix given by primary; patient seem to be euvolemic; need BB, ACE when stable;  monitor   9. Acute on chronic anemia; no s/s of acute bleeding; recheck Hg, TF prn; check occult blood   Thank you for the consult we will follow up  Code Status: full Family Communication: d/w aptient (indicate person spoken with, relationship, and if by phone, the number) Disposition Plan: pend clinical improvement    Consultants:  ortho  Procedures:  Pend surgery  Antibiotics:  none (indicate start date, and stop date if known)  HPI/Subjective: alert  Objective: Filed Vitals:   08/28/13 0952  BP: 151/40  Pulse:   Temp:   Resp:     Intake/Output Summary (Last 24 hours) at 08/28/13 1112 Last data filed at 08/28/13 0618  Gross per 24 hour  Intake   1520 ml  Output   3350 ml  Net   -1830 ml   Filed Weights   08/27/13 0900  Weight: 70.308 kg (155 lb)    Exam:   General:  alert  Cardiovascular: s1,s2 rrr  Respiratory: CTA BL  Abdomen: soft, nt, nd   Musculoskeletal: leg warpped  Data Reviewed: Basic Metabolic Panel:  Recent Labs Lab 08/26/13 1005 08/26/13 1245 08/27/13 1120 08/28/13 0540  NA 137 137 139 138  K 4.3 4.5 5.1 4.3  CL 98 100 103 103  CO2 24 22 23 22   GLUCOSE 174* 142* 137* 133*  BUN 21 23 30* 24*  CREATININE 1.65* 1.79* 2.17* 1.79*  CALCIUM 9.1 8.7 8.1* 7.9*  PHOS  --   --  3.7  --    Liver Function Tests:  Recent Labs Lab 08/26/13 1245 08/27/13 1120  AST 17  --   ALT 14  --   ALKPHOS 74  --   BILITOT 0.3  --   PROT 6.4  --   ALBUMIN 3.1* 3.0*   No results found for this basename: LIPASE, AMYLASE,  in the last 168 hours No results found for this basename: AMMONIA,  in the last 168 hours CBC:  Recent Labs Lab 08/26/13 1005 08/26/13 1245 08/28/13 0540  WBC 12.8* 14.8* 10.4  NEUTROABS 10.6*  --   --   HGB 11.8* 11.1* 7.8*  HCT 34.1* 31.8* 23.0*  MCV 85.3 85.0 86.1  PLT 376 393 275   Cardiac Enzymes: No results found for this basename: CKTOTAL, CKMB, CKMBINDEX, TROPONINI,  in the last 168 hours BNP (last 3 results) No results found for this basename: PROBNP,  in the last 8760  hours CBG:  Recent Labs Lab 08/27/13 1126 08/27/13 1254 08/27/13 1658 08/27/13 2141 08/28/13 0634  GLUCAP 133* 136* 135* 139* 131*    Recent Results (from the past 240 hour(s))  URINE CULTURE     Status: None   Collection Time    08/26/13  1:08 PM      Result Value Range Status   Specimen Description URINE, CATHETERIZED   Final   Special Requests NONE   Final   Culture  Setup Time     Final   Value: 08/26/2013 20:10     Performed at [facility] Count     Final   Value: NO GROWTH     Performed at [facility]   Culture     Final   Value: NO GROWTH     Performed at [facility]    Report Status 08/27/2013 FINAL   Final  SURGICAL PCR SCREEN     Status: None   Collection Time    08/27/13  5:09 AM      Result Value Range Status   MRSA, PCR NEGATIVE  NEGATIVE Final   Staphylococcus aureus NEGATIVE  NEGATIVE Final   Comment:            The Xpert SA Assay (FDA     approved for NASAL specimens     in patients over 56 years of age),     is one component of     a comprehensive surveillance     program.  Test performance has     been validated by [employer] for patients greater     than or equal to 54 year old.     It is not intended     to diagnose infection nor to     guide or monitor treatment.     Studies: Ct Tibia Fibula Right Wo Contrast  08/26/2013   CLINICAL DATA:  Patient fell down stairs.  Right leg pain.  EXAM: CT OF THE RIGHT TIBIA FIBULA WITHOUT CONTRAST  TECHNIQUE: Multidetector CT imaging of the bilateral lower extremities was performed according to the standard protocol. Multiplanar CT image reconstructions were also generated.  COMPARISON:  None.  FINDINGS: There is a comminuted fracture of the medial and lateral tibial plateaus and involving the tibial spine. There is minimal depression of the medial aspect of the lateral tibial plateau measuring 2 mm. There is no significant depression of the medial tibial plateau.  There is a comminuted fracture of the proximal tibial metaphysis which extends to the inferior aspect of the tibial tuberosity at the patellar tendon insertion. There is a comminuted fracture extending into the proximal and mid tibial diaphysis with a butterfly fragment posteriorly.  There is no knee dislocation. There is a lipohemarthrosis. There are tricompartmental osteoarthritic changes most severe in the lateral femoral tibial compartment.  There is a comminuted fracture of the proximal fibular head and metaphysis without significant displacement or angulation. The fracture extends to the articular surface without disruption of the  proximal tibiofibular joint.  The soft tissues are normal. There is no soft tissue mass. There is soft tissue edema along the anteromedial aspect of the right lower leg. There is peripheral vascular atherosclerotic disease.  IMPRESSION: 1. Comminuted fracture involving the medial and lateral tibial plateaus as well as the tibial spine extending into the proximal tibial metaphysis and into the proximal -mid tibial diaphysis as described above. 2. Comminuted fracture of the proximal  fibular head.   Electronically Signed   By: Kathreen Devoid   On: 08/26/2013 12:42   Dg Chest Port 1 View  08/27/2013   CLINICAL DATA:  Wheezing.  COPD.  EXAM: PORTABLE CHEST - 1 VIEW  COMPARISON:  DG CHEST 1 VIEW dated 08/26/2013; DG CHEST 2 VIEW dated 03/09/2006  FINDINGS: There are lower lung volumes. Allowing for this, the diffuse interstitial prominence appears unchanged. There is no definite superimposed edema, airspace disease or pleural effusion. The heart size and mediastinal contours are stable. There is atherosclerosis of the aortic arch.  IMPRESSION: Stable interstitial prominence.  No acute superimposed process.   Electronically Signed   By: Camie Patience M.D.   On: 08/27/2013 14:44    Scheduled Meds: . acetaminophen  650 mg Oral Once  . albuterol  2.5 mg Nebulization QID  . aspirin  81 mg Oral Daily  . diphenhydrAMINE  25 mg Oral Once  . docusate sodium  100 mg Oral BID  . enoxaparin (LOVENOX) injection  30 mg Subcutaneous Q24H  . furosemide  20 mg Intravenous Once  . furosemide  20 mg Intravenous Once  . insulin aspart  0-15 Units Subcutaneous TID WC  . levofloxacin  500 mg Oral Daily  . ondansetron (ZOFRAN) IV  4 mg Intravenous Q8H  . simvastatin  5 mg Oral Daily   Continuous Infusions: . sodium chloride 100 mL/hr at 08/28/13 9604    Active Problems:   Closed right tibial fracture   Tibial fracture   Peripheral vascular disease   Carotid stenosis, bilateral   COPD (chronic obstructive pulmonary  disease)   Hyperlipemia   Hypertension   Diabetes mellitus without complication   Undiagnosed cardiac murmurs   Wheezing   Proteinuria   Elevated serum creatinine    Time spent: >35 minutes     Kinnie Feil  [facility] Pager [phone]. If 7PM-7AM, please contact night-coverage at [email or web address], password [facility] 08/28/2013, 11:12 AM  LOS: 2 days

## 2013-08-28 NOTE — Consult Note (Addendum)
[facility] PROGRESS NOTE  Lindsey Weber YWV:[id number] DOB: 17-Mar-1941 DOA: 08/26/2013 PCP: [name], MD  Assessment/Plan: 73 year old white female history of diabetes, hypertension, suspected COPD/long-standing tobacco use, dyslipidemia was admitted to the orthopedic service last night following a fall and complex right tibial plateau and shaft fracture.  Hospitalists were consulted for medical management.  1. Right tibial fracture; Per orthopedics -preop 2-D echocardiogram: ? RA mass; pend TEE;   2. Suspected COPD/long-standing tobacco abuse; bronchitis; CXR: no clear infiltrate; but fever 2/4 -cont prn bronchodilators, levofloxacin added; stop smoking; need OP PFTs   3. Diabetes mellitus; HA1C-7.1; d/c metformin with CKD, cont sliding scale insulin inpatient, glipizide upon discharge   4. CKD II, Likely chronic no baseline labs, await nephrotoxic agents; UA with proteinuria   5. Carotid artery disease R; Start ASA, continue statin; FU with vascular   6. Hypertension increase amlodipine   7. Chronic CHF; echo:LVEF 85%, grade 1 diastolic dysfunction, AS with AVA 1.3, gradient 16 -noted lasix given by primary; patient seem to be euvolemic; need BB, ACE when stable;  monitor   9. Acute on chronic anemia; no s/s of acute bleeding; recheck Hg, TF prn; check occult blood   Thank you for the consult we will follow up  Code Status: full Family Communication: d/w aptient (indicate person spoken with, relationship, and if by phone, the number) Disposition Plan: pend clinical improvement    Consultants:  ortho  Procedures:  Pend surgery  Antibiotics:  none (indicate start date, and stop date if known)  HPI/Subjective: alert  Objective: Filed Vitals:   08/28/13 0952  BP: 151/40  Pulse:   Temp:   Resp:     Intake/Output Summary (Last 24 hours) at 08/28/13 1112 Last data filed at 08/28/13 0618  Gross per 24 hour  Intake   1520 ml  Output   3350 ml  Net   -1830 ml   Filed Weights   08/27/13 0900  Weight: 70.308 kg (155 lb)    Exam:   General:  alert  Cardiovascular: s1,s2 rrr  Respiratory: CTA BL  Abdomen: soft, nt, nd   Musculoskeletal: leg warpped  Data Reviewed: Basic Metabolic Panel:  Recent Labs Lab 08/26/13 1005 08/26/13 1245 08/27/13 1120 08/28/13 0540  NA 137 137 139 138  K 4.3 4.5 5.1 4.3  CL 98 100 103 103  CO2 24 22 23 22   GLUCOSE 174* 142* 137* 133*  BUN 21 23 30* 24*  CREATININE 1.65* 1.79* 2.17* 1.79*  CALCIUM 9.1 8.7 8.1* 7.9*  PHOS  --   --  3.7  --    Liver Function Tests:  Recent Labs Lab 08/26/13 1245 08/27/13 1120  AST 17  --   ALT 14  --   ALKPHOS 74  --   BILITOT 0.3  --   PROT 6.4  --   ALBUMIN 3.1* 3.0*   No results found for this basename: LIPASE, AMYLASE,  in the last 168 hours No results found for this basename: AMMONIA,  in the last 168 hours CBC:  Recent Labs Lab 08/26/13 1005 08/26/13 1245 08/28/13 0540  WBC 12.8* 14.8* 10.4  NEUTROABS 10.6*  --   --   HGB 11.8* 11.1* 7.8*  HCT 34.1* 31.8* 23.0*  MCV 85.3 85.0 86.1  PLT 376 393 275   Cardiac Enzymes: No results found for this basename: CKTOTAL, CKMB, CKMBINDEX, TROPONINI,  in the last 168 hours BNP (last 3 results) No results found for this basename: PROBNP,  in the last 8760  hours CBG:  Recent Labs Lab 08/27/13 1126 08/27/13 1254 08/27/13 1658 08/27/13 2141 08/28/13 0634  GLUCAP 133* 136* 135* 139* 131*    Recent Results (from the past 240 hour(s))  URINE CULTURE     Status: None   Collection Time    08/26/13  1:08 PM      Result Value Range Status   Specimen Description URINE, CATHETERIZED   Final   Special Requests NONE   Final   Culture  Setup Time     Final   Value: 08/26/2013 20:10     Performed at [facility] Count     Final   Value: NO GROWTH     Performed at [facility]   Culture     Final   Value: NO GROWTH     Performed at [facility]    Report Status 08/27/2013 FINAL   Final  SURGICAL PCR SCREEN     Status: None   Collection Time    08/27/13  5:09 AM      Result Value Range Status   MRSA, PCR NEGATIVE  NEGATIVE Final   Staphylococcus aureus NEGATIVE  NEGATIVE Final   Comment:            The Xpert SA Assay (FDA     approved for NASAL specimens     in patients over 80 years of age),     is one component of     a comprehensive surveillance     program.  Test performance has     been validated by [employer] for patients greater     than or equal to 58 year old.     It is not intended     to diagnose infection nor to     guide or monitor treatment.     Studies: Ct Tibia Fibula Right Wo Contrast  08/26/2013   CLINICAL DATA:  Patient fell down stairs.  Right leg pain.  EXAM: CT OF THE RIGHT TIBIA FIBULA WITHOUT CONTRAST  TECHNIQUE: Multidetector CT imaging of the bilateral lower extremities was performed according to the standard protocol. Multiplanar CT image reconstructions were also generated.  COMPARISON:  None.  FINDINGS: There is a comminuted fracture of the medial and lateral tibial plateaus and involving the tibial spine. There is minimal depression of the medial aspect of the lateral tibial plateau measuring 2 mm. There is no significant depression of the medial tibial plateau.  There is a comminuted fracture of the proximal tibial metaphysis which extends to the inferior aspect of the tibial tuberosity at the patellar tendon insertion. There is a comminuted fracture extending into the proximal and mid tibial diaphysis with a butterfly fragment posteriorly.  There is no knee dislocation. There is a lipohemarthrosis. There are tricompartmental osteoarthritic changes most severe in the lateral femoral tibial compartment.  There is a comminuted fracture of the proximal fibular head and metaphysis without significant displacement or angulation. The fracture extends to the articular surface without disruption of the  proximal tibiofibular joint.  The soft tissues are normal. There is no soft tissue mass. There is soft tissue edema along the anteromedial aspect of the right lower leg. There is peripheral vascular atherosclerotic disease.  IMPRESSION: 1. Comminuted fracture involving the medial and lateral tibial plateaus as well as the tibial spine extending into the proximal tibial metaphysis and into the proximal -mid tibial diaphysis as described above. 2. Comminuted fracture of the proximal  fibular head.   Electronically Signed   By: Kathreen Devoid   On: 08/26/2013 12:42   Dg Chest Port 1 View  08/27/2013   CLINICAL DATA:  Wheezing.  COPD.  EXAM: PORTABLE CHEST - 1 VIEW  COMPARISON:  DG CHEST 1 VIEW dated 08/26/2013; DG CHEST 2 VIEW dated 03/09/2006  FINDINGS: There are lower lung volumes. Allowing for this, the diffuse interstitial prominence appears unchanged. There is no definite superimposed edema, airspace disease or pleural effusion. The heart size and mediastinal contours are stable. There is atherosclerosis of the aortic arch.  IMPRESSION: Stable interstitial prominence.  No acute superimposed process.   Electronically Signed   By: Camie Patience M.D.   On: 08/27/2013 14:44    Scheduled Meds: . acetaminophen  650 mg Oral Once  . albuterol  2.5 mg Nebulization QID  . aspirin  81 mg Oral Daily  . diphenhydrAMINE  25 mg Oral Once  . docusate sodium  100 mg Oral BID  . enoxaparin (LOVENOX) injection  30 mg Subcutaneous Q24H  . furosemide  20 mg Intravenous Once  . furosemide  20 mg Intravenous Once  . insulin aspart  0-15 Units Subcutaneous TID WC  . levofloxacin  500 mg Oral Daily  . ondansetron (ZOFRAN) IV  4 mg Intravenous Q8H  . simvastatin  5 mg Oral Daily   Continuous Infusions: . sodium chloride 100 mL/hr at 08/28/13 2130    Active Problems:   Closed right tibial fracture   Tibial fracture   Peripheral vascular disease   Carotid stenosis, bilateral   COPD (chronic obstructive pulmonary  disease)   Hyperlipemia   Hypertension   Diabetes mellitus without complication   Undiagnosed cardiac murmurs   Wheezing   Proteinuria   Elevated serum creatinine    Time spent: >35 minutes     Kinnie Feil  [facility] Pager [phone]. If 7PM-7AM, please contact night-coverage at [email or web address], password [facility] 08/28/2013, 11:12 AM  LOS: 2 days

## 2013-08-28 NOTE — Evaluation (Signed)
Physical Therapy Evaluation Patient Details Name: Lindsey Weber MRN: [id number] DOB: [birth year] Today's Date: 08/28/2013 Time: 0930-0950 PT Time Calculation (min): 20 min  PT Assessment / Plan / Recommendation History of Present Illness  complex R tibial plateau fracture and R tibial shaft fracture  Clinical Impression  Pt has not yet had surgery for R tibail fractures, as MD is waiting for swelling to go down.  MD okayed SPT to recliner with NWB, but pt declined OOB and sat on edge of bed.  Pt moved well despite 10/10 pain and anticipate that she should do well after ortho surgery.  Pt does not want to go to rehab and wants to go home with husband's A.    PT Assessment  Patient needs continued PT services    Follow Up Recommendations  Home health PT    Does the patient have the potential to tolerate intense rehabilitation      Barriers to Discharge        Equipment Recommendations  Rolling walker with 5" wheels    Recommendations for Other Services     Frequency Min 5X/week    Precautions / Restrictions Precautions Precautions: Fall Required Braces or Orthoses: Other Brace/Splint Other Brace/Splint: long leg splint Restrictions Weight Bearing Restrictions: Yes RLE Weight Bearing: Non weight bearing   Pertinent Vitals/Pain 10/10 R LE with it fully supported throughout transfer to EOB and while seated EOB.      Mobility  Bed Mobility Overal bed mobility: Needs Assistance Bed Mobility: Supine to Sit Supine to sit: Min assist;HOB elevated Transfers General transfer comment: Pt did not want to try due to has not had surgery yet even though Ortho note said OK for bed to chair transfers with NWB/ing RLE    Exercises     PT Diagnosis: Difficulty walking  PT Problem List: Decreased activity tolerance;Pain;Decreased mobility;Decreased knowledge of use of DME PT Treatment Interventions: Gait training;Stair training;Functional mobility training;Therapeutic  activities;Therapeutic exercise;DME instruction     PT Goals(Current goals can be found in the care plan section) Acute Rehab PT Goals Patient Stated Goal: to go home, not to rehab PT Goal Formulation: With patient Time For Goal Achievement: 09/11/13 Potential to Achieve Goals: Good  Visit Information  Last PT Received On: 08/28/13 Assistance Needed: +2 Reason for Co-Treatment: For patient/therapist safety;Other (comment) (pt has not yet had surgery) History of Present Illness: complex R tibial plateau fracture and R tibial shaft fracture       Prior Functioning  Home Living Family/patient expects to be discharged to:: Private residence Living Arrangements: Spouse/significant other Available Help at Discharge: Family;Available 24 hours/day Type of Home: House Home Access: Stairs to enter [employer] of Steps: 2 Entrance Stairs-Rails: None Home Layout: One level Home Equipment: None Prior Function Level of Independence: Independent Communication Communication: No difficulties Dominant Hand: Right    Cognition  Cognition Arousal/Alertness: Awake/alert Behavior During Therapy: WFL for tasks assessed/performed Overall Cognitive Status: Within Functional Limits for tasks assessed    Extremity/Trunk Assessment Upper Extremity Assessment Upper Extremity Assessment: Defer to OT evaluation Lower Extremity Assessment Lower Extremity Assessment: RLE deficits/detail RLE Deficits / Details: R LE tibail shaft fracture that has not been repaired surgically yet. RLE: Unable to fully assess due to immobilization;Unable to fully assess due to pain   Balance    End of Session PT - End of Session Activity Tolerance: Patient tolerated treatment well;Patient limited by pain Patient left: in bed Nurse Communication: Mobility status  GP     Rockell Faulks LUBECK  08/28/2013, 12:34 PM

## 2013-08-28 NOTE — Interval H&P Note (Deleted)
History and Physical Interval Note:  08/28/2013 11:41 AM  Lindsey Weber  has presented today for surgery, with the diagnosis of r/o RA mass  The various methods of treatment have been discussed with the patient and family. After consideration of risks, benefits and other options for treatment, the patient has consented to  Procedure(s): TRANSESOPHAGEAL ECHOCARDIOGRAM (TEE) (N/A) as a surgical intervention .  The patient's history has been reviewed, patient examined, no change in status, stable for surgery.  I have reviewed the patient's chart and labs.  Questions were answered to the patient's satisfaction.     Ena Dawley, H

## 2013-08-28 NOTE — Evaluation (Signed)
Occupational Therapy Evaluation Patient Details Name: Lindsey Weber MRN: [id number] DOB: 09-Jul-1941 Today's Date: 08/28/2013 Time: 6948-5462 OT Time Calculation (min): 20 min  OT Assessment / Plan / Recommendation History of present illness complex R tibial plateau fracture and R tibial shaft fracture   Clinical Impression   This 73 yo female admitted with above presents to acute OT with decreased AROM RLE, increased pain RLE with movement all affecting pt's PLOF of being very active and independent, will benefit from acute OT with follow up [place].    OT Assessment  Patient needs continued OT Services    Follow Up Recommendations  Home health OT       Equipment Recommendations  3 in 1 bedside comode;Wheelchair (measurements OT);Wheelchair cushion (measurements OT) (elevating leg rests, anti-tippers)       Frequency  Min 2X/week    Precautions / Restrictions Precautions Precautions: Fall Required Braces or Orthoses: Other Brace/Splint Other Brace/Splint: long leg splint Restrictions Weight Bearing Restrictions: Yes RLE Weight Bearing: Non weight bearing   Pertinent Vitals/Pain 10/10 RLE with getting up to EOB, repositioned back in bed with 2 pillows and raised foot of bed    ADL  Eating/Feeding: Independent Where Assessed - Eating/Feeding: Bed level Grooming: Set up Where Assessed - Grooming: Supine, head of bed up Upper Body Bathing: Set up Where Assessed - Upper Body Bathing: Supine, head of bed up Lower Body Bathing: Moderate assistance Where Assessed - Lower Body Bathing: Supine, head of bed up;Supine, head of bed flat;Rolling right and/or left Upper Body Dressing: Set up Where Assessed - Upper Body Dressing: Supine, head of bed up Lower Body Dressing: +1 Total assistance Where Assessed - Lower Body Dressing: Supine, head of bed up;Supine, head of bed flat;Rolling right and/or left Transfers/Ambulation Related to ADLs: Pt did not want to do any more than to EOB  since she had not had surgery yet (even though per ortho note bed to chair transfers fine with [place] RLE    OT Diagnosis: Generalized weakness;Acute pain  OT Problem List: Decreased range of motion;Impaired balance (sitting and/or standing);Pain;Decreased knowledge of use of DME or AE OT Treatment Interventions: Self-care/ADL training;Patient/family education;Balance training;DME and/or AE instruction   OT Goals(Current goals can be found in the care plan section) Acute Rehab OT Goals Patient Stated Goal: to go home, not to rehab OT Goal Formulation: With patient Time For Goal Achievement: 09/04/13 Potential to Achieve Goals: Good  Visit Information  Last OT Received On: 08/28/13 Assistance Needed: +2 PT/OT/SLP Co-Evaluation/Treatment: Yes Reason for Co-Treatment:  (has not had surgery yet) History of Present Illness: complex R tibial plateau fracture and R tibial shaft fracture       Prior Functioning     Home Living Family/patient expects to be discharged to:: Private residence Living Arrangements: Spouse/significant other Available Help at Discharge: Family;Available 24 hours/day Type of Home: House Home Access: Stairs to enter [employer] of Steps: 2 Entrance Stairs-Rails: None Home Layout: One level Home Equipment: None Prior Function Level of Independence: Independent Communication Communication: No difficulties Dominant Hand: Right         Vision/Perception Vision - History Baseline Vision: No visual deficits   Cognition  Cognition Arousal/Alertness: Awake/alert Behavior During Therapy: WFL for tasks assessed/performed Overall Cognitive Status: Within Functional Limits for tasks assessed    Extremity/Trunk Assessment Upper Extremity Assessment Upper Extremity Assessment: Defer to OT evaluation Lower Extremity Assessment Lower Extremity Assessment: Defer to PT evaluation     Mobility Bed Mobility Overal bed mobility: Needs  Assistance Bed Mobility: Supine to Sit Supine to sit: Min assist;HOB elevated (RLE) Transfers General transfer comment: Pt did not want to try due to has not had surgery yet even though Ortho note said OK for bed to chair transfers with NWB/ing RLE           End of Session OT - End of Session Activity Tolerance: Patient tolerated treatment well (even though said pain 10/10) Patient left: in bed;with call bell/phone within reach Nurse Communication: Mobility status       Almon Register [id number] 08/28/2013, 11:48 AM

## 2013-08-29 ENCOUNTER — Encounter (HOSPITAL_COMMUNITY): Payer: Self-pay | Admitting: Cardiology

## 2013-08-29 DIAGNOSIS — I350 Nonrheumatic aortic (valve) stenosis: Secondary | ICD-10-CM

## 2013-08-29 DIAGNOSIS — I10 Essential (primary) hypertension: Secondary | ICD-10-CM

## 2013-08-29 HISTORY — DX: Nonrheumatic aortic (valve) stenosis: I35.0

## 2013-08-29 LAB — GLUCOSE, CAPILLARY
Glucose-Capillary: 128 mg/dL — ABNORMAL HIGH (ref 70–99)
Glucose-Capillary: 115 mg/dL — ABNORMAL HIGH (ref 70–99)
Glucose-Capillary: 106 mg/dL — ABNORMAL HIGH (ref 70–99)
Glucose-Capillary: 123 mg/dL — ABNORMAL HIGH (ref 70–99)

## 2013-08-29 LAB — TYPE AND SCREEN
ABO/RH(D): O POS
ANTIBODY SCREEN: NEGATIVE
Unit division: 0
Unit division: 0

## 2013-08-29 LAB — CBC
HEMATOCRIT: 32.6 % — AB (ref 36.0–46.0)
Hemoglobin: 11.6 g/dL — ABNORMAL LOW (ref 12.0–15.0)
MCH: 30.5 pg (ref 26.0–34.0)
MCHC: 35.6 g/dL (ref 30.0–36.0)
MCV: 85.8 fL (ref 78.0–100.0)
PLATELETS: 261 10*3/uL (ref 150–400)
RBC: 3.8 MIL/uL — ABNORMAL LOW (ref 3.87–5.11)
RDW: 13.3 % (ref 11.5–15.5)
WBC: 12 10*3/uL — AB (ref 4.0–10.5)

## 2013-08-29 LAB — PTH, INTACT AND CALCIUM
Calcium, Total (PTH): 8 mg/dL — ABNORMAL LOW (ref 8.4–10.5)
PTH: 127.2 pg/mL — ABNORMAL HIGH (ref 14.0–72.0)

## 2013-08-29 LAB — BASIC METABOLIC PANEL
BUN: 24 mg/dL — AB (ref 6–23)
CO2: 22 meq/L (ref 19–32)
CREATININE: 1.59 mg/dL — AB (ref 0.50–1.10)
Calcium: 8.4 mg/dL (ref 8.4–10.5)
Chloride: 99 mEq/L (ref 96–112)
GFR calc Af Amer: 36 mL/min — ABNORMAL LOW (ref >90)
GFR calc non Af Amer: 31 mL/min — ABNORMAL LOW (ref >90)
Glucose, Bld: 137 mg/dL — ABNORMAL HIGH (ref 70–99)
Potassium: 3.6 mEq/L — ABNORMAL LOW (ref 3.7–5.3)
Sodium: 139 mEq/L (ref 137–147)

## 2013-08-29 LAB — TSH: TSH: 6.758 u[IU]/mL — AB (ref 0.350–4.500)

## 2013-08-29 MED ORDER — LEVOFLOXACIN 500 MG PO TABS: 500.0000 mg | ORAL_TABLET | Freq: Every day | ORAL | Status: DC

## 2013-08-29 MED ORDER — GLIPIZIDE 5 MG PO TABS: 2.5000 mg | ORAL_TABLET | Freq: Every day | ORAL | Status: DC

## 2013-08-29 MED ORDER — AMLODIPINE BESYLATE 5 MG PO TABS: 5.0000 mg | ORAL_TABLET | Freq: Every day | ORAL | Status: DC

## 2013-08-29 MED ORDER — HYDROCODONE-ACETAMINOPHEN 5-325 MG PO TABS: 1.0000 | ORAL_TABLET | Freq: Four times a day (QID) | ORAL | Status: DC | PRN

## 2013-08-29 MED ORDER — OXYCODONE HCL 5 MG PO TABS: 5.0000 mg | ORAL_TABLET | ORAL | Status: DC | PRN

## 2013-08-29 MED ORDER — ENOXAPARIN SODIUM 30 MG/0.3ML ~~LOC~~ SOLN: 30.0000 mg | SUBCUTANEOUS | Status: DC

## 2013-08-29 MED ORDER — ASPIRIN 81 MG PO CHEW: 81.0000 mg | CHEWABLE_TABLET | Freq: Every day | ORAL | Status: AC

## 2013-08-29 MED ORDER — ALBUTEROL SULFATE (2.5 MG/3ML) 0.083% IN NEBU: 2.5000 mg | INHALATION_SOLUTION | RESPIRATORY_TRACT | Status: DC | PRN

## 2013-08-29 MED ORDER — DSS 100 MG PO CAPS: 100.0000 mg | ORAL_CAPSULE | Freq: Two times a day (BID) | ORAL | Status: DC

## 2013-08-29 MED ORDER — AMLODIPINE BESYLATE 5 MG PO TABS
5.0000 mg | ORAL_TABLET | Freq: Every day | ORAL | Status: DC
  Administered 2013-08-29 – 2013-08-30 (×2): 5 mg via ORAL
  Filled 2013-08-29 (×2): qty 1

## 2013-08-29 MED ORDER — METHOCARBAMOL 500 MG PO TABS: 500.0000 mg | ORAL_TABLET | Freq: Four times a day (QID) | ORAL | Status: DC | PRN

## 2013-08-29 MED ORDER — LEVOFLOXACIN 250 MG PO TABS
250.0000 mg | ORAL_TABLET | Freq: Every day | ORAL | Status: DC
  Administered 2013-08-30: 250 mg via ORAL
  Filled 2013-08-29: qty 1

## 2013-08-29 NOTE — Progress Notes (Signed)
Physical Therapy Treatment Patient Details Name: AAIMA GADDIE MRN: [id number] DOB: 12-Mar-1941 Today's Date: 08/29/2013 Time: 4098-1191 PT Time Calculation (min): 24 min  PT Assessment / Plan / Recommendation  History of Present Illness complex R tibial plateau fracture and R tibial shaft fracture   PT Comments   Pt progressing well towards physical therapy goals. She reports increased anxiety at the thought of moving, and was visibly trembling at times. Overall, pt did very well with mobility and was able to perform transfers with min guard to min assist, ambulation of 8 feet, and negotiation of 1 step backwards with walker. Will need another PT session prior to d/c, however anticipate pt will do well at home with husband's assist.   Follow Up Recommendations  Home health PT     Does the patient have the potential to tolerate intense rehabilitation     Barriers to Discharge        Equipment Recommendations  Rolling walker with 5" wheels    Recommendations for Other Services    Frequency Min 5X/week   Progress towards PT Goals Progress towards PT goals: Progressing toward goals  Plan Current plan remains appropriate    Precautions / Restrictions Precautions Precautions: Fall Required Braces or Orthoses: Other Brace/Splint Other Brace/Splint: long leg splint - pt resplinted with posterior LLS and medial/lateral struts Restrictions Weight Bearing Restrictions: Yes RLE Weight Bearing: Non weight bearing   Pertinent Vitals/Pain 0/10 pain after ambulation and stair training, however asks for medication for anxiety. RN notified.    Mobility  Bed Mobility Overal bed mobility: Needs Assistance Bed Mobility: Supine to Sit Supine to sit: Min assist General bed mobility comments: VC's for sequencing and technique with HOB flat and no bed rail use to simulate home environment. Assist for support of RLE during transition to EOB, however pt was able to move the RLE without help.   Transfers Overall transfer level: Needs assistance Equipment used: Rolling walker (2 wheeled) Transfers: Sit to/from Stand Sit to Stand: Mod assist;Min guard General transfer comment: Mod assist required to power-up to full stand first attempt. Min guard required to come to full stand on second attempt. VC's for hand placement on seated surface for safety.  Ambulation/Gait Ambulation/Gait assistance: Min guard Ambulation Distance (Feet): 8 Feet Assistive device: Rolling walker (2 wheeled) Gait Pattern/deviations: Step-to pattern;Decreased stride length Gait velocity: Decreased Gait velocity interpretation: Below normal speed for age/gender General Gait Details: VC's for sequencing and safety awareness with the RW. Pt did well to maintain NWB status on R.  Stairs: Yes Stairs assistance: Min assist Stair Management: No rails;Backwards;With walker Number of Stairs: 1 General stair comments: VC's for sequencing and safety awareness, particularly with taking a big enough step onto the stair to allow for room for walker placement after.     Exercises     PT Diagnosis:    PT Problem List:   PT Treatment Interventions:     PT Goals (current goals can now be found in the care plan section) Acute Rehab PT Goals Patient Stated Goal: to go home, not to rehab PT Goal Formulation: With patient Time For Goal Achievement: 09/11/13 Potential to Achieve Goals: Good  Visit Information  Last PT Received On: 08/29/13 Assistance Needed: +1 PT/OT/SLP Co-Evaluation/Treatment: Yes Reason for Co-Treatment: Other (comment);Complexity of the patient's impairments (multi-system involvement) (Pt anxiety; not yet had surgery) PT goals addressed during session: Mobility/safety with mobility;Balance;Proper use of DME History of Present Illness: complex R tibial plateau fracture and R tibial shaft  fracture    Subjective Data  Subjective: "I am so nervous about moving right now." Patient Stated Goal: to go  home, not to rehab   Cognition  Cognition Arousal/Alertness: Awake/alert Behavior During Therapy: WFL for tasks assessed/performed Overall Cognitive Status: Within Functional Limits for tasks assessed    Balance  Balance Overall balance assessment: Needs assistance Sitting-balance support: Feet supported;Bilateral upper extremity supported Sitting balance-Leahy Scale: Fair Sitting balance - Comments: Pt sits to the L side to take weight off the R hip and LE.  Postural control: Left lateral lean Standing balance support: Bilateral upper extremity supported Standing balance-Leahy Scale: Fair  End of Session PT - End of Session Equipment Utilized During Treatment: Gait belt Activity Tolerance: Patient limited by fatigue Patient left: in chair;with call bell/phone within reach Nurse Communication: Mobility status   GP     Jolyn Lent 08/29/2013, 2:08 PM  Jolyn Lent, [place], DPT [phone]

## 2013-08-29 NOTE — Progress Notes (Signed)
Orthopedic Tech Progress Note Patient Details:  Lindsey Weber [birth year] [id number] Call from [name] stating he no longer wanted to use the plaster he previously asked for to reconstruct patient's splint; decided splinting with palster was going to be too heavy. Requested fiberglass for long leg splint. Supplied with 5'' long prefab as well as 3'' prefab. Assisted [name] and Dr. Eddie Dibbles with application of posterior long leg splint with struts on each side extending from mid thigh to calf.  Patient ID: Cherlynn Perches, female   DOB: 05/08/1941, 73 y.o.   MRN: [id number]   Fenton Foy 08/29/2013, 11:57 AM

## 2013-08-29 NOTE — Discharge Summary (Signed)
Orthopaedic Trauma Service (OTS)  Patient ID: Lindsey Weber MRN: [id number] DOB/AGE: 73-Dec-1942 73 y.o.  Admit date: 08/26/2013 Discharge date: 08/30/13  Admission Diagnoses: Closed R tibial shaft and tibial plateau fractures PVD COPD Hyperlipidemia HTN DM  Elevated serum Cr Proteinuria   Discharge Diagnoses:  Principal Problem:   Closed right tibial fracture Active Problems:   Tibial fracture   Peripheral vascular disease   Carotid stenosis, bilateral   COPD (chronic obstructive pulmonary disease)   Hyperlipemia   Hypertension   Diabetes mellitus without complication   Undiagnosed cardiac murmurs   Wheezing   Proteinuria   Elevated serum creatinine   Aortic valve stenosis   Procedures Performed: Initial splint applied on day of admission with subsequent blistering New long leg splint applied by [name] and [facility] 08/29/13  Bedside transthroacic echo- 08/27/2013  Study Conclusions  - Left ventricle: The cavity size was normal. Wall thickness was increased in a pattern of mild LVH. There was mild focal basal hypertrophy of the septum. Systolic function was vigorous. The estimated ejection fraction was in the range of 65% to 70%. Wall motion was normal; there were no regional wall motion abnormalities. Doppler parameters are consistent with abnormal left ventricular relaxation (grade 1 diastolic dysfunction). - Aortic valve: There was mild stenosis. Valve area: 1.69cm^2(VTI). Valve area: 1.37cm^2 (Vmax). Valve area: 1.46cm^2 (Vmean). - Mitral valve: Calcified annulus. - Left atrium: The atrium was mildly dilated. Impressions:  - Possible RA mass apical 4 chamber view; suggest TEE or cardiac MRI to further assess.   TEE- 08/28/2013  Study Conclusions  - Left ventricle: Systolic function was normal. The estimated ejection fraction was in the range of 55% to 60%. Wall motion was normal; there were no regional wall motion abnormalities. - Aorta: There  is severe non-mobile atherosclerotic plague in the descending thoracic aorta. - Mitral valve: Mild regurgitation. - Left atrium: The atrium was dilated. No evidence of thrombus in the atrial cavity or appendage. No evidence of thrombus in the atrial cavity or appendage. - Right atrium: No evidence of thrombus in the atrial cavity or appendage. No evidence of thrombus in the appendage. - Atrial septum: A very prominent lipomatous hypertrophy of the interatrial septum. No defect or patent foramen ovale was identified. Impressions:  - No right atrial mass was identified. The structure seen on TTE is a very prominent lipomatous hypertrophy of the interatrial septum.   Transfusion of 2 units PRBC's- 08/28/2013  Splint change R leg- posterior LLS and medial/lateral struts- 08/29/2013  Discharged Condition: good  Hospital Course:   Patient is a 73 year old white female who is admitted on 08/26/2013 after sustaining a ground-level fall with resultant right tibial plateau and tibial shaft fractures. She was initially seen and evaluated at [facility] and was ultimately transferred to [place]. Patient was seen by the orthopedic service on 08/27/2013. Internal medicine consult was also obtained given her medical issues. On initial evaluation by the orthopedic trauma service the patient was noted to have a systolic murmur. This was news to the patient and as such we did pursue further workup which included a bedside transthoracic echo. There is some questionable findings on the echo in terms of a possible mass in the right atrium and as such a TEE was ordered which did not demonstrate a mass in the right atrium. The patient also did require transfusion on 08/28/2013 for symptomatic anemia. She tolerated the transfusion well. We did reevaluate her soft tissue during  her hospital stay and it was noted that the patient did have pretty severe fracture blistering along the anticipated site  surgical approach. As such her splint was changed and dressings were applied to blisters as well as waxing blisters to evacuate fluid. Patient was then resplinted stabilize her fracture.  given the findings of this significant soft tissue injury we did need to delay surgical intervention for about 2 weeks. As such patient worked with therapies on a regular basis to get her ready to be discharged home while we awaited resolution of her soft tissue injuries. On 08/30/2013 patient was stable for discharge home with home health.  Also during her hospital stay we did note that the patient had an elevated creatinine level. We presume that this may be due to her metformin use. Her metformin was stopped and she was then transitioned to glipizide at discharge. During her hospital stay she was controlled on sliding scale insulin. She'll follow up with her PCP for this.  Consults: Internal medicine   Significant Diagnostic Studies: labs:   Results for LIANETTE, BROUSSARD (MRN [id number]) as of 08/29/2013 12:26  Ref. Range 08/29/2013 02:45  Sodium Latest Range: 137-147 mEq/L 139  Potassium Latest Range: 3.7-5.3 mEq/L 3.6 (L)  Chloride Latest Range: 96-112 mEq/L 99  CO2 Latest Range: 19-32 mEq/L 22  BUN Latest Range: 6-23 mg/dL 24 (H)  Creatinine Latest Range: 0.50-1.10 mg/dL 1.59 (H)  Calcium Latest Range: 8.4-10.5 mg/dL 8.4  GFR calc non Af Amer Latest Range: >90 mL/min 31 (L)  GFR calc Af Amer Latest Range: >90 mL/min 36 (L)  Glucose Latest Range: 70-99 mg/dL 137 (H)  WBC Latest Range: 4.0-10.5 K/uL 12.0 (H)  RBC Latest Range: 3.87-5.11 MIL/uL 3.80 (L)  Hemoglobin Latest Range: 12.0-15.0 g/dL 11.6 (L)  HCT Latest Range: 36.0-46.0 % 32.6 (L)  MCV Latest Range: 78.0-100.0 fL 85.8  MCH Latest Range: 26.0-34.0 pg 30.5  MCHC Latest Range: 30.0-36.0 g/dL 35.6  RDW Latest Range: 11.5-15.5 % 13.3  Platelets Latest Range: 150-400 K/uL 261    and cardiac graphics: Echocardiogram: as above in procedures    Treatments: IV hydration, antibiotics: Levaquin, analgesia: acetaminophen, Dilaudid and norco, anticoagulation: ASA (low dose) and LMW heparin, therapies: PT, OT, RN and SW, procedures: TTE and TEE  Discharge Exam:          Orthopaedic Trauma Service Progress Note  Subjective  Doing well this am Pain controlled No new issues In great spirits Tolerated transfusion of 2 units of PRBC's well yesterday   TEE yesterday did not show RA mass, it was a prominent lipomatous hypertrophy of the interatrial septum   Review of Systems  Constitutional: Negative for fever and chills.  Eyes: Negative for blurred vision.  Respiratory: Negative for shortness of breath.   Cardiovascular: Negative for chest pain and palpitations.  Gastrointestinal: Negative for nausea and vomiting.  Neurological: Positive for sensory change.       [place]um of R foot      Objective   BP 161/55  Pulse 92  Temp(Src) 98.2 F (36.8 C) (Oral)  Resp 16  Ht 5\' 8"  (1.727 m)  Wt 70.308 kg (155 lb)  BMI 23.57 kg/m2  SpO2 94%  Intake/Output     02/05 0701 - 02/06 0700 02/06 0701 - 02/07 0700    P.O.  240    I.V. (mL/kg) 50 (0.7)     Blood 662.5     Total Intake(mL/kg) 712.5 (10.1) 240 (3.4)    Urine (mL/kg/hr) 6250 (3.7)  Total Output 6250      Net -5537.5 +240            Labs Results for OTHELL, DILUZIO (MRN [id number]) as of 08/29/2013 11:59   Ref. Range  08/29/2013 02:45   Sodium  Latest Range: 137-147 mEq/L  139   Potassium  Latest Range: 3.7-5.3 mEq/L  3.6 (L)   Chloride  Latest Range: 96-112 mEq/L  99   CO2  Latest Range: 19-32 mEq/L  22   BUN  Latest Range: 6-23 mg/dL  24 (H)   Creatinine  Latest Range: 0.50-1.10 mg/dL  1.59 (H)   Calcium  Latest Range: 8.4-10.5 mg/dL  8.4   GFR calc non Af Amer  Latest Range: >90 mL/min  31 (L)   GFR calc Af Amer  Latest Range: >90 mL/min  36 (L)   Glucose  Latest Range: 70-99 mg/dL  137 (H)   WBC  Latest Range: 4.0-10.5 K/uL  12.0 (H)   RBC  Latest Range:  3.87-5.11 MIL/uL  3.80 (L)   Hemoglobin  Latest Range: 12.0-15.0 g/dL  11.6 (L)   HCT  Latest Range: 36.0-46.0 %  32.6 (L)   MCV  Latest Range: 78.0-100.0 fL  85.8   MCH  Latest Range: 26.0-34.0 pg  30.5   MCHC  Latest Range: 30.0-36.0 g/dL  35.6   RDW  Latest Range: 11.5-15.5 %  13.3   Platelets  Latest Range: 150-400 K/uL  261     Exam  Gen: awake and alert, NAD, comfortable Lungs: clear B anterior fields Cardiac: + systolic murmur, s1 and s2 Abd: soft, NTND, + BS Ext:        Right lower Extremity               Splint removed             Extensive fx blistering to anterolateral leg             + swelling and ecchymosis as well             DPN, SPN, TN sensation decreased             EHL, FHL, Lesser toe motor intact             AT and PT motor intact, peroneal motor intact             gastroc motor intact             Still unable to appreciate DP pulse             Ext warm             Heel without pressure sore             No pain with passive stretch     Assessment and Plan   POD/HD#: 77    73 year old white female status post fall with complex right tibial shaft and tibial plateau fractures.              1. Complex right tibial plateau fracture with right tibial shaft fracture           Substantial swelling present           Fracture blister in zone of incision           Pt definitely needs plate fixation to address her fracture but soft tissue swelling will prevent early intervention           pt resplinted with posterior LLS  and medial/lateral struts           We will recheck soft tissue in a few days and plan for OR 10-14 days for definitive fixation           Continue with ice and elevation             PT/OT as tolerated           NWB R leg           Toe ROM as tolerated              2. wheezing right> left/COPD           continue with PRN albuterol           levaquin until 2/8           Outpt PFTs           Smoking cessation               3. systolic  murmur- Aortic stenosi           TEE negative for mass                         4. elevated creatinine/proteinuria           Cr slightly improved again this             Continue to hold metformin               Continue with hydration   5. chronic medical issues including hypertension, hyperlipidemia, and diabetes, carotid artery disease             Norvac held due to low BP           Wide pulse pressue likely related to atherosclerotic disease           Hold metformin as noted above           SSI and carbohydrate modified diet           Glipizide at dc- 2.5 mg po daily             Follow up with pcp in 1 week           Defer referral to vascular surgeon for carotid artery disease to PCP as it appears he is following closely   6. DVT and PE prophylaxis           Lovenox for now           low dose asa therapy ok             SCD to left leg  7. Pain           Continue with IV Dilaudid for severe pain           Will add oral Norco 5-325, one to 2 every 6 hours as needed for pain           Robaxin for muscle spasms  8. nicotine dependence           No nicotine products during her healing phase secondary to negative effects on wound and bone healing      9. ABL anemia           stable           No symptoms   10. FEN           advance as tolerated  10. Disposition          PT/OT today to work on mobilizing           Dc home tomorrow           Follow up with ortho 09/03/2013           Follow up with pcp 1 week  Appreciate IM assistance with this pt                Jari Pigg, PA-C [facility] [phone] (P) 08/29/2013 11:57 AM       Disposition: Home with home health       Discharge Orders   Future Orders Complete By Expires   Call MD / Call 911  As directed    Comments:     If you experience chest pain or shortness of breath, CALL 911 and be transported to the hospital emergency room.  If you develope a fever above 101 F, pus (white  drainage) or increased drainage or redness at the wound, or calf pain, call your surgeon's office.   Constipation Prevention  As directed    Comments:     Drink plenty of fluids.  Prune juice may be helpful.  You may use a stool softener, such as Colace (over the counter) 100 mg twice a day.  Use MiraLax (over the counter) for constipation as needed.   Diet - low sodium heart healthy  As directed    Discharge instructions  As directed    Comments:     Orthopaedic Trauma Service Discharge Instructions   General Discharge Instructions  WEIGHT BEARING STATUS: Nonweight bearing Left leg  RANGE OF MOTION/ACTIVITY:no range of motion, activity as tolerated while maintaining Weightbearing restrictions   Diet: heart healthy .  Can use over the counter stool softeners and bowel preparations, such as Miralax, to help with bowel movements.  Narcotics can be constipating.  Be sure to drink plenty of fluids  STOP SMOKING OR USING NICOTINE PRODUCTS!!!!  As discussed nicotine severely impairs your body's ability to heal surgical and traumatic wounds but also impairs bone healing.  Wounds and bone heal by forming microscopic blood vessels (angiogenesis) and nicotine is a vasoconstrictor (essentially, shrinks blood vessels).  Therefore, if vasoconstriction occurs to these microscopic blood vessels they essentially disappear and are unable to deliver necessary nutrients to the healing tissue.  This is one modifiable factor that you can do to dramatically increase your chances of healing your injury.    (This means no smoking, no nicotine gum, patches, etc)  DO NOT USE NONSTEROIDAL ANTI-INFLAMMATORY DRUGS (NSAID'S)  Using products such as Advil (ibuprofen), Aleve (naproxen), Motrin (ibuprofen) for additional pain control during fracture healing can delay and/or prevent the healing response.  If you would like to take over the counter (OTC) medication, Tylenol (acetaminophen) is ok.  However, some narcotic  medications that are given for pain control contain acetaminophen as well. Therefore, you should not exceed more than 4000 mg of tylenol in a day if you do not have liver disease.  Also note that there are may OTC medicines, such as cold medicines and allergy medicines that my contain tylenol as well.  If you have any questions about medications and/or interactions please ask your doctor/PA or your pharmacist.   PAIN MEDICATION USE AND EXPECTATIONS  You have likely been given narcotic medications to help control your pain.  After a traumatic event that results in an fracture (broken bone) with or without surgery, it  is ok to use narcotic pain medications to help control one's pain.  We understand that everyone responds to pain differently and each individual patient will be evaluated on a regular basis for the continued need for narcotic medications. Ideally, narcotic medication use should last no more than 6-8 weeks (coinciding with fracture healing).   As a patient it is your responsibility as well to monitor narcotic medication use and report the amount and frequency you use these medications when you come to your office visit.   We would also advise that if you are using narcotic medications, you should take a dose prior to therapy to maximize you participation.  IF YOU ARE ON NARCOTIC MEDICATIONS IT IS NOT PERMISSIBLE TO OPERATE A MOTOR VEHICLE (MOTORCYCLE/CAR/TRUCK/MOPED) OR HEAVY MACHINERY DO NOT MIX NARCOTICS WITH OTHER CNS (CENTRAL NERVOUS SYSTEM) DEPRESSANTS SUCH AS ALCOHOL       ICE AND ELEVATE INJURED/OPERATIVE EXTREMITY  Using ice and elevating the injured extremity above your heart can help with swelling and pain control.  Icing in a pulsatile fashion, such as 20 minutes on and 20 minutes off, can be followed.    Do not place ice directly on skin. Make sure there is a barrier between to skin and the ice pack.    Using frozen items such as frozen peas works well as the conform nicely to  the are that needs to be iced.  USE AN ACE WRAP OR TED HOSE FOR SWELLING CONTROL  In addition to icing and elevation, Ace wraps or TED hose are used to help limit and resolve swelling.  It is recommended to use Ace wraps or TED hose until you are informed to stop.    When using Ace Wraps start the wrapping distally (farthest away from the body) and wrap proximally (closer to the body)   Example: If you had surgery on your leg or thing and you do not have a splint on, start the ace wrap at the toes and work your way up to the thigh        If you had surgery on your upper extremity and do not have a splint on, start the ace wrap at your fingers and work your way up to the upper arm  IF YOU ARE IN A SPLINT OR CAST DO NOT [place]   If your splint gets wet for any reason please contact the office immediately. You may shower in your splint or cast as long as you keep it dry.  This can be done by wrapping in a cast cover or garbage back (or similar)  Do Not stick any thing down your splint or cast such as pencils, money, or hangers to try and scratch yourself with.  If you feel itchy take benadryl as prescribed on the bottle for itching  IF YOU ARE IN A CAM BOOT (BLACK BOOT)  You may remove boot periodically. Perform daily dressing changes as noted below.  Wash the liner of the boot regularly and wear a sock when wearing the boot. It is recommended that you sleep in the boot until told otherwise  CALL THE OFFICE WITH ANY QUESTIONS OR CONCERTS: [phone]     Discharge Pin Site Instructions  Dress pins daily with Kerlix roll starting on POD 2. Wrap the Kerlix so that it tamps the skin down around the pin-skin interface to prevent/limit motion of the skin relative to the pin.  (Pin-skin motion is the primary cause of pain and infection related  to external fixator pin sites).  Remove any crust or coagulum that may obstruct drainage with a saline moistened gauze or soap and  water.  After POD 3, if there is no discernable drainage on the pin site dressing, the interval for change can by increased to every other day.  You may shower with the fixator, cleaning all pin sites gently with soap and water.  If you have a surgical wound this needs to be completely dry and without drainage before showering.  The extremity can be lifted by the fixator to facilitate wound care and transfers.  Notify the office/Doctor if you experience increasing drainage, redness, or pain from a pin site, or if you notice purulent (thick, snot-like) drainage.  Discharge Wound Care Instructions  Do NOT apply any ointments, solutions or lotions to pin sites or surgical wounds.  These prevent needed drainage and even though solutions like hydrogen peroxide kill bacteria, they also damage cells lining the pin sites that help fight infection.  Applying lotions or ointments can keep the wounds moist and can cause them to breakdown and open up as well. This can increase the risk for infection. When in doubt call the office.  Surgical incisions should be dressed daily.  If any drainage is noted, use one layer of adaptic, then gauze, Kerlix, and an ace wrap.  Once the incision is completely dry and without drainage, it may be left open to air out.  Showering may begin 36-48 hours later.  Cleaning gently with soap and water.  Traumatic wounds should be dressed daily as well.    One layer of adaptic, gauze, Kerlix, then ace wrap.  The adaptic can be discontinued once the draining has ceased    If you have a wet to dry dressing: wet the gauze with saline the squeeze as much saline out so the gauze is moist (not soaking wet), place moistened gauze over wound, then place a dry gauze over the moist one, followed by Kerlix wrap, then ace wrap.   Driving restrictions  As directed    Comments:     No driving   Increase activity slowly as tolerated  As directed    Non weight bearing  As directed     Questions:     Laterality:     Extremity:         Medication List    STOP taking these medications       aspirin 325 MG tablet  Replaced by:  aspirin 81 MG chewable tablet     metFORMIN 500 MG tablet  Commonly known as:  GLUCOPHAGE      TAKE these medications       albuterol (2.5 MG/3ML) 0.083% nebulizer solution  Commonly known as:  PROVENTIL  Inhale 3 mLs (2.5 mg total) into the lungs every 4 (four) hours as needed for wheezing or shortness of breath.     ALPRAZolam 0.5 MG tablet  Commonly known as:  XANAX  Take 0.5 mg by mouth as needed for anxiety.     amLODipine 5 MG tablet  Commonly known as:  NORVASC  Take 1 tablet (5 mg total) by mouth daily.     aspirin 81 MG chewable tablet  Chew 1 tablet (81 mg total) by mouth daily.     calcium carbonate 600 MG Tabs tablet  Commonly known as:  OS-CAL  Take 600 mg by mouth 2 (two) times daily with a meal.     Co Q 10 100 MG Caps  Take 1 capsule by mouth daily.     cyanocobalamin 1000 MCG tablet  Take 100 mcg by mouth daily.     cyanocobalamin 500 MCG tablet  Take 500 mcg by mouth daily.     DSS 100 MG Caps  Take 100 mg by mouth 2 (two) times daily.     enoxaparin 30 MG/0.3ML injection  Commonly known as:  LOVENOX  Inject 0.3 mLs (30 mg total) into the skin daily.     glipiZIDE 5 MG tablet  Commonly known as:  GLUCOTROL  Take 0.5 tablets (2.5 mg total) by mouth daily before breakfast.     HYDROcodone-acetaminophen 5-325 MG per tablet  Commonly known as:  NORCO/VICODIN  Take 1-2 tablets by mouth every 6 (six) hours as needed for moderate pain or severe pain.     levofloxacin 500 MG tablet  Commonly known as:  LEVAQUIN  Take 1 tablet (500 mg total) by mouth daily.     methocarbamol 500 MG tablet  Commonly known as:  ROBAXIN  Take 1-2 tablets (500-1,000 mg total) by mouth every 6 (six) hours as needed for muscle spasms.     multivitamin with minerals tablet  Take 1 tablet by mouth daily.     omega-3 acid  ethyl esters 1 G capsule  Commonly known as:  LOVAZA  Take 1 g by mouth daily.     oxyCODONE 5 MG immediate release tablet  Commonly known as:  ROXICODONE  Take 1 tablet (5 mg total) by mouth every 4 (four) hours as needed for breakthrough pain (take between hydrocodone doses for breakthrough pain only).     simvastatin 5 MG tablet  Commonly known as:  ZOCOR  Take 5 mg by mouth daily.       Follow-up Information   Follow up with Rozanna Box, MD On 09/03/2013. (call for appointment time )    Specialty:  Orthopedic Surgery   Contact information:   [address] [place] [address] [phone]       Follow up with [name], MD. Schedule an appointment as soon as possible for a visit in 1 week.   Specialty:  Internal Medicine   Contact information:   [place] [place] [address] [phone]       Discharge Instructions and Plan:  Patient has sustained a severe injury to her right tibial plateau and right tibial shaft. She will require open reduction internal fixation with a bridge plating technique. would plan on proceeding with this in the next 10-14 days or so. This should allow for adequate resolution of her soft tissue swelling as well as for reepithelialization of her fracture blisters Patient will be nonweightbearing on her right leg for now on to 8 weeks after surgery. No range of motion at current time but will be unrestricted in terms of range of motion after surgery. Patient will continue with aggressive ice elevation Resume carb modified diet The patient will followup with her primary care doctor in next 1-2 weeks to reevaluate some of these findings noted during this admission as well as medication changes  We'll check the patient back in the office in 7-10 days for reevaluation of her soft tissue  Signed:  Jari Pigg, PA-C [facility] [phone] (P) 08/29/2013, 12:24 PM

## 2013-08-29 NOTE — Progress Notes (Signed)
08/29/13 Spoke with patient about [facility] and gave her [facility] agency list for [facility]. Patient stated that she wanted to discuss decision with her daughter this evening. CM will continue to follow to set up [facility]. Contacted Brent with [facility], they will deliver rolling walker and  wheelchair to patient's room this evening. Lindsey Weber states that she has a 3N1 at home.  Fuller Plan RN, BSN, CCM

## 2013-08-29 NOTE — Discharge Instructions (Signed)
[facility] Discharge Instructions   General Discharge Instructions  WEIGHT BEARING STATUS: Nonweight bearing Left leg  RANGE OF MOTION/ACTIVITY:no range of motion, activity as tolerated while maintaining Weightbearing restrictions   Diet: heart healthy .  Can use over the counter stool softeners and bowel preparations, such as Miralax, to help with bowel movements.  Narcotics can be constipating.  Be sure to drink plenty of fluids  STOP SMOKING OR USING NICOTINE PRODUCTS!!!!  As discussed nicotine severely impairs your body's ability to heal surgical and traumatic wounds but also impairs bone healing.  Wounds and bone heal by forming microscopic blood vessels (angiogenesis) and nicotine is a vasoconstrictor (essentially, shrinks blood vessels).  Therefore, if vasoconstriction occurs to these microscopic blood vessels they essentially disappear and are unable to deliver necessary nutrients to the healing tissue.  This is one modifiable factor that you can do to dramatically increase your chances of healing your injury.    (This means no smoking, no nicotine gum, patches, etc)  DO NOT USE NONSTEROIDAL ANTI-INFLAMMATORY DRUGS (NSAID'S)  Using products such as Advil (ibuprofen), Aleve (naproxen), Motrin (ibuprofen) for additional pain control during fracture healing can delay and/or prevent the healing response.  If you would like to take over the counter (OTC) medication, Tylenol (acetaminophen) is ok.  However, some narcotic medications that are given for pain control contain acetaminophen as well. Therefore, you should not exceed more than 4000 mg of tylenol in a day if you do not have liver disease.  Also note that there are may OTC medicines, such as cold medicines and allergy medicines that my contain tylenol as well.  If you have any questions about medications and/or interactions please ask your doctor/PA or your pharmacist.   PAIN MEDICATION USE AND EXPECTATIONS  You have  likely been given narcotic medications to help control your pain.  After a traumatic event that results in an fracture (broken bone) with or without surgery, it is ok to use narcotic pain medications to help control one's pain.  We understand that everyone responds to pain differently and each individual patient will be evaluated on a regular basis for the continued need for narcotic medications. Ideally, narcotic medication use should last no more than 6-8 weeks (coinciding with fracture healing).   As a patient it is your responsibility as well to monitor narcotic medication use and report the amount and frequency you use these medications when you come to your office visit.   We would also advise that if you are using narcotic medications, you should take a dose prior to therapy to maximize you participation.  IF YOU ARE ON NARCOTIC MEDICATIONS IT IS NOT PERMISSIBLE TO OPERATE A MOTOR VEHICLE (MOTORCYCLE/CAR/TRUCK/MOPED) OR HEAVY MACHINERY DO NOT MIX NARCOTICS WITH OTHER CNS (CENTRAL NERVOUS SYSTEM) DEPRESSANTS SUCH AS ALCOHOL       ICE AND ELEVATE INJURED/OPERATIVE EXTREMITY  Using ice and elevating the injured extremity above your heart can help with swelling and pain control.  Icing in a pulsatile fashion, such as 20 minutes on and 20 minutes off, can be followed.    Do not place ice directly on skin. Make sure there is a barrier between to skin and the ice pack.    Using frozen items such as frozen peas works well as the conform nicely to the are that needs to be iced.  USE AN ACE WRAP OR TED HOSE FOR SWELLING CONTROL  In addition to icing and elevation, Ace wraps or TED hose are used to  help limit and resolve swelling.  It is recommended to use Ace wraps or TED hose until you are informed to stop.    When using Ace Wraps start the wrapping distally (farthest away from the body) and wrap proximally (closer to the body)   Example: If you had surgery on your leg or thing and you do not have a  splint on, start the ace wrap at the toes and work your way up to the thigh        If you had surgery on your upper extremity and do not have a splint on, start the ace wrap at your fingers and work your way up to the upper arm  IF YOU ARE IN A SPLINT OR CAST DO NOT Hooker   If your splint gets wet for any reason please contact the office immediately. You may shower in your splint or cast as long as you keep it dry.  This can be done by wrapping in a cast cover or garbage back (or similar)  Do Not stick any thing down your splint or cast such as pencils, money, or hangers to try and scratch yourself with.  If you feel itchy take benadryl as prescribed on the bottle for itching  IF YOU ARE IN A CAM BOOT (BLACK BOOT)  You may remove boot periodically. Perform daily dressing changes as noted below.  Wash the liner of the boot regularly and wear a sock when wearing the boot. It is recommended that you sleep in the boot until told otherwise  CALL THE OFFICE WITH ANY QUESTIONS OR CONCERTS: [phone]     Discharge Pin Site Instructions  Dress pins daily with Kerlix roll starting on POD 2. Wrap the Kerlix so that it tamps the skin down around the pin-skin interface to prevent/limit motion of the skin relative to the pin.  (Pin-skin motion is the primary cause of pain and infection related to external fixator pin sites).  Remove any crust or coagulum that may obstruct drainage with a saline moistened gauze or soap and water.  After POD 3, if there is no discernable drainage on the pin site dressing, the interval for change can by increased to every other day.  You may shower with the fixator, cleaning all pin sites gently with soap and water.  If you have a surgical wound this needs to be completely dry and without drainage before showering.  The extremity can be lifted by the fixator to facilitate wound care and transfers.  Notify the office/Doctor if you experience increasing  drainage, redness, or pain from a pin site, or if you notice purulent (thick, snot-like) drainage.  Discharge Wound Care Instructions  Do NOT apply any ointments, solutions or lotions to pin sites or surgical wounds.  These prevent needed drainage and even though solutions like hydrogen peroxide kill bacteria, they also damage cells lining the pin sites that help fight infection.  Applying lotions or ointments can keep the wounds moist and can cause them to breakdown and open up as well. This can increase the risk for infection. When in doubt call the office.  Surgical incisions should be dressed daily.  If any drainage is noted, use one layer of adaptic, then gauze, Kerlix, and an ace wrap.  Once the incision is completely dry and without drainage, it may be left open to air out.  Showering may begin 36-48 hours later.  Cleaning gently with soap and water.  Traumatic wounds should be  dressed daily as well.    One layer of adaptic, gauze, Kerlix, then ace wrap.  The adaptic can be discontinued once the draining has ceased    If you have a wet to dry dressing: wet the gauze with saline the squeeze as much saline out so the gauze is moist (not soaking wet), place moistened gauze over wound, then place a dry gauze over the moist one, followed by Kerlix wrap, then ace wrap.

## 2013-08-29 NOTE — Progress Notes (Signed)
[facility]opaedic [facility]uma [facility] Progress Note  Subjective  Doing well this am Pain controlled No new issues In great spirits Tolerated transfusion of 2 units of PRBC's well yesterday   TEE yesterday did not show RA mass, it was a prominent lipomatous hypertrophy of the interatrial septum   Review of Systems  Constitutional: Negative for fever and chills.  Eyes: Negative for blurred vision.  Respiratory: Negative for shortness of breath.   Cardiovascular: Negative for chest pain and palpitations.  Gastrointestinal: Negative for nausea and vomiting.  Neurological: Positive for sensory change.       [place] of R foot      Objective   BP 161/55  Pulse 92  Temp(Src) 98.2 F (36.8 C) (Oral)  Resp 16  Ht 5\' 8"  (1.727 m)  Wt 70.308 kg (155 lb)  BMI 23.57 kg/m2  SpO2 94%  Intake/Output     02/05 0701 - 02/06 0700 02/06 0701 - 02/07 0700   P.O.  240   I.V. (mL/kg) 50 (0.7)    Blood 662.5    Total Intake(mL/kg) 712.5 (10.1) 240 (3.4)   Urine (mL/kg/hr) 6250 (3.7)    Total Output 6250     Net -5537.5 +240          Labs Results for Lindsey, Weber (MRN [id number]) as of 08/29/2013 11:59  Ref. Range 08/29/2013 02:45  Sodium Latest Range: 137-147 mEq/L 139  Potassium Latest Range: 3.7-5.3 mEq/L 3.6 (L)  Chloride Latest Range: 96-112 mEq/L 99  CO2 Latest Range: 19-32 mEq/L 22  BUN Latest Range: 6-23 mg/dL 24 (H)  Creatinine Latest Range: 0.50-1.10 mg/dL 1.59 (H)  Calcium Latest Range: 8.4-10.5 mg/dL 8.4  GFR calc non Af Amer Latest Range: >90 mL/min 31 (L)  GFR calc Af Amer Latest Range: >90 mL/min 36 (L)  Glucose Latest Range: 70-99 mg/dL 137 (H)  WBC Latest Range: 4.0-10.5 K/uL 12.0 (H)  RBC Latest Range: 3.87-5.11 MIL/uL 3.80 (L)  Hemoglobin Latest Range: 12.0-15.0 g/dL 11.6 (L)  HCT Latest Range: 36.0-46.0 % 32.6 (L)  MCV Latest Range: 78.0-100.0 fL 85.8  MCH Latest Range: 26.0-34.0 pg 30.5  MCHC Latest Range: 30.0-36.0 g/dL 35.6  RDW Latest Range: 11.5-15.5 % 13.3   Platelets Latest Range: 150-400 K/uL 261    Exam  Gen: awake and alert, NAD, comfortable Lungs: clear B anterior fields Cardiac: + systolic murmur, s1 and s2 Abd: soft, NTND, + BS Ext:       Right lower Extremity   Splint removed  Extensive fx blistering to anterolateral leg  + swelling and ecchymosis as well  DPN, SPN, TN sensation decreased  EHL, FHL, Lesser toe motor intact  AT and PT motor intact, peroneal motor intact  gastroc motor intact  Still unable to appreciate DP pulse  Ext warm  Heel without pressure sore  No pain with passive stretch     Assessment and Plan   POD/HD#: 26    73 year old white female status post fall with complex right tibial shaft and tibial plateau fractures.              1. Complex right tibial plateau fracture with right tibial shaft fracture           Substantial swelling present           Fracture blister in zone of incision           Pt definitely needs plate fixation to address her fracture but soft tissue swelling will prevent early intervention  PROCEDURE: pt resplinted with posterior LLS and medial/lateral struts AND all soft tissue blisters dressed by Marcelino Scot MD and Eddie Dibbles [facility]            We will recheck soft tissue in a few days and plan for OR 10-14 days for definitive fixation           Continue with ice and elevation             PT/OT as tolerated           NWB R leg  Toe ROM as tolerated             2. wheezing right> left/COPD           continue with PRN albuterol  levaquin until 2/8  Outpt PFTs  Smoking cessation              3. systolic murmur- Aortic stenosi           TEE negative for mass                        4. elevated creatinine/proteinuria           Cr slightly improved again this            Continue to hold metformin               Continue with hydration   5. chronic medical issues including hypertension, hyperlipidemia, and diabetes, carotid artery disease            Norvac held due to low  BP           Wide pulse pressue likely related to atherosclerotic disease           Hold metformin as noted above           SSI and carbohydrate modified diet  Glipizide at dc- 2.5 mg po daily   Follow up with pcp in 1 week  Defer referral to vascular surgeon for carotid artery disease to PCP as it appears he is following closely   6. DVT and PE prophylaxis           Lovenox for now           low dose asa therapy ok             SCD to left leg  7. Pain           Continue with IV Dilaudid for severe pain           Will add oral Norco 5-325, one to 2 every 6 hours as needed for pain           Robaxin for muscle spasms  8. nicotine dependence           No nicotine products during her healing phase secondary to negative effects on wound and bone healing      9. ABL anemia           stable  No symptoms   10. FEN           advance as tolerated         10. Disposition          PT/OT today to work on mobilizing  Dc home tomorrow  Follow up with ortho 09/03/2013  Follow up with pcp 1 week  Appreciate IM assistance with this pt  Jari Pigg, PA-C [facility] [phone] (P) 08/29/2013 11:57 AM

## 2013-08-29 NOTE — Consult Note (Signed)
[facility] PROGRESS NOTE  Lindsey Weber OIZ:[id number] DOB: [birth year] DOA: 08/26/2013 PCP: [name], MD  Assessment/Plan: 73 year old white female history of diabetes, hypertension, suspected COPD/long-standing tobacco use, dyslipidemia was admitted to the orthopedic service last night following a fall and complex right tibial plateau and shaft fracture.  Hospitalists were consulted for medical management.  1. Right tibial fracture; defer to orthopedics    2. Suspected COPD/long-standing tobacco abuse; bronchitis; CXR: no clear infiltrate; but fever 2/4 -cont prn bronchodilators, levofloxacin added (d/c date 2/8 ); stop smoking; need OP PFTs   3. Diabetes mellitus; HA1C-7.1; d/c metformin with CKD, cont sliding scale insulin inpatient, glipizide upon discharge and f/u with PCP 1week   4. CKD II, Likely chronic no baseline labs, awoid nephrotoxic agents; UA with proteinuria   5. Carotid artery disease R; Start ASA, continue statin; FU with vascular   6. Hypertension increase amlodipine   7. Chronic CHF; echo:LVEF 33%, grade 1 diastolic dysfunction, AS with AVA 1.3, gradient 16 -echo: ? Mass lesion; TEE: No right atrial mass was identified. The structure seen on TTE is a very prominent lipomatous hypertrophy of the interatrial septum -patient seem to be euvolemic; cont monitor off diuretics; may need ACE outpatient eval   9. Acute on chronic anemia; no s/s of acute bleeding; recheck Hg, TFsed ; Hg stable   Thank you for the consult we will follow up as needed, d/c plans per primary   Code Status: full Family Communication: d/w aptient (indicate person spoken with, relationship, and if by phone, the number) Disposition Plan: pend clinical improvement    Consultants:  ortho  Procedures:  Pend surgery  Antibiotics:  none (indicate start date, and stop date if known)  HPI/Subjective: alert  Objective: Filed Vitals:   08/29/13 0423  BP: 173/56  Pulse: 92   Temp: 98.2 F (36.8 C)  Resp: 16    Intake/Output Summary (Last 24 hours) at 08/29/13 0956 Last data filed at 08/29/13 0829  Gross per 24 hour  Intake  952.5 ml  Output   6250 ml  Net -5297.5 ml   Filed Weights   08/27/13 0900  Weight: 70.308 kg (155 lb)    Exam:   General:  alert  Cardiovascular: s1,s2 rrr  Respiratory: CTA BL  Abdomen: soft, nt, nd   Musculoskeletal: leg warpped  Data Reviewed: Basic Metabolic Panel:  Recent Labs Lab 08/26/13 1005 08/26/13 1245 08/27/13 1120 08/28/13 0540 08/29/13 0245  NA 137 137 139 138 139  K 4.3 4.5 5.1 4.3 3.6*  CL 98 100 103 103 99  CO2 24 22 23 22 22   GLUCOSE 174* 142* 137* 133* 137*  BUN 21 23 30* 24* 24*  CREATININE 1.65* 1.79* 2.17* 1.79* 1.59*  CALCIUM 9.1 8.7 8.1* 7.9* 8.4  PHOS  --   --  3.7  --   --    Liver Function Tests:  Recent Labs Lab 08/26/13 1245 08/27/13 1120  AST 17  --   ALT 14  --   ALKPHOS 74  --   BILITOT 0.3  --   PROT 6.4  --   ALBUMIN 3.1* 3.0*   No results found for this basename: LIPASE, AMYLASE,  in the last 168 hours No results found for this basename: AMMONIA,  in the last 168 hours CBC:  Recent Labs Lab 08/26/13 1005 08/26/13 1245 08/28/13 0540 08/29/13 0245  WBC 12.8* 14.8* 10.4 12.0*  NEUTROABS 10.6*  --   --   --   HGB 11.8* 11.1*  7.8* 11.6*  HCT 34.1* 31.8* 23.0* 32.6*  MCV 85.3 85.0 86.1 85.8  PLT 376 393 275 261   Cardiac Enzymes: No results found for this basename: CKTOTAL, CKMB, CKMBINDEX, TROPONINI,  in the last 168 hours BNP (last 3 results) No results found for this basename: PROBNP,  in the last 8760 hours CBG:  Recent Labs Lab 08/28/13 1132 08/28/13 1521 08/28/13 1629 08/28/13 2124 08/29/13 0632  GLUCAP 122* 110* 91 99 128*    Recent Results (from the past 240 hour(s))  URINE CULTURE     Status: None   Collection Time    08/26/13  1:08 PM      Result Value Range Status   Specimen Description URINE, CATHETERIZED   Final   Special  Requests NONE   Final   Culture  Setup Time     Final   Value: 08/26/2013 20:10     Performed at [place]     Final   Value: NO GROWTH     Performed at [facility]   Culture     Final   Value: NO GROWTH     Performed at [facility]   Report Status 08/27/2013 FINAL   Final  SURGICAL PCR SCREEN     Status: None   Collection Time    08/27/13  5:09 AM      Result Value Range Status   MRSA, PCR NEGATIVE  NEGATIVE Final   Staphylococcus aureus NEGATIVE  NEGATIVE Final   Comment:            The Xpert SA Assay (FDA     approved for NASAL specimens     in patients over 61 years of age),     is one component of     a comprehensive surveillance     program.  Test performance has     been validated by [employer] for patients greater     than or equal to 64 year old.     It is not intended     to diagnose infection nor to     guide or monitor treatment.     Studies: Dg Chest Port 1 View  08/27/2013   CLINICAL DATA:  Wheezing.  COPD.  EXAM: PORTABLE CHEST - 1 VIEW  COMPARISON:  DG CHEST 1 VIEW dated 08/26/2013; DG CHEST 2 VIEW dated 03/09/2006  FINDINGS: There are lower lung volumes. Allowing for this, the diffuse interstitial prominence appears unchanged. There is no definite superimposed edema, airspace disease or pleural effusion. The heart size and mediastinal contours are stable. There is atherosclerosis of the aortic arch.  IMPRESSION: Stable interstitial prominence.  No acute superimposed process.   Electronically Signed   By: Camie Patience M.D.   On: 08/27/2013 14:44    Scheduled Meds: . amLODipine  5 mg Oral Daily  . aspirin  81 mg Oral Daily  . docusate sodium  100 mg Oral BID  . enoxaparin (LOVENOX) injection  30 mg Subcutaneous Q24H  . insulin aspart  0-15 Units Subcutaneous TID WC  . levofloxacin  500 mg Oral Daily  . ondansetron (ZOFRAN) IV  4 mg Intravenous Q8H  . simvastatin  5 mg Oral Daily   Continuous Infusions: .  sodium chloride 75 mL/hr at 08/28/13 2007  . sodium chloride      Active Problems:   Closed right tibial fracture   Tibial fracture   Peripheral vascular disease  Carotid stenosis, bilateral   COPD (chronic obstructive pulmonary disease)   Hyperlipemia   Hypertension   Diabetes mellitus without complication   Undiagnosed cardiac murmurs   Wheezing   Proteinuria   Elevated serum creatinine    Time spent: >35 minutes     Kinnie Feil  [facility] Pager [phone]. If 7PM-7AM, please contact night-coverage at [email or web address], password [facility] 08/29/2013, 9:56 AM  LOS: 3 days

## 2013-08-29 NOTE — Progress Notes (Signed)
Occupational Therapy Treatment and Discharge Patient Details Name: Lindsey Weber MRN: [id number] DOB: 23-Jul-1941 Today's Date: 08/29/2013 Time: 3299-2426 OT Time Calculation (min): 24 min  OT Assessment / Plan / Recommendation  History of present illness complex R tibial plateau fracture and R tibial shaft fracture--surgery in 10-14 days   OT comments  This 73 yo female admitted with above presents to acute OT with all education completed and per her report husband will A her prn. No further acute OT needs identified, we will sign off.  Follow Up Recommendations  Home health OT       Equipment Recommendations  Wheelchair (measurements OT);Wheelchair cushion (measurements OT) (elevating leg rests and anti-tippers)       Frequency Min 2X/week   Progress towards OT Goals Progress towards OT goals: Goals met/education completed, patient discharged from [place] Discharge plan remains appropriate    Precautions / Restrictions Precautions Precautions: Fall Required Braces or Orthoses: Other Brace/Splint Other Brace/Splint: long leg splint - pt resplinted with posterior LLS and medial/lateral struts Restrictions Weight Bearing Restrictions: Yes RLE Weight Bearing: Non weight bearing   Pertinent Vitals/Pain No c/o today    ADL  Toilet Transfer: Minimal assistance Toilet Transfer Method: Sit to stand Toilet Transfer Equipment:  (Bed>hop to window>sit in recliner) Equipment Used: Gait belt;Rolling walker Transfers/Ambulation Related to ADLs: see mobility section ADL Comments: Pt states that husband will A her with any LBB/D that she needs A with. We also talked about simple clothing to be worn at home (long gown and no underwear), when going out, elastic waist pants. Pt will sponge bath for now      OT Goals(current goals can now be found in the care plan section) Acute Rehab OT Goals Patient Stated Goal: to go home, not to rehab  Visit Information  Last OT Received On:  08/29/13 Assistance Needed: +1 PT/OT/SLP Co-Evaluation/Treatment: Yes Reason for Co-Treatment: Complexity of the patient's impairments (multi-system involvement) (pt anxiety and has not yet had surgery) PT goals addressed during session: Mobility/safety with mobility;Balance;Proper use of DME History of Present Illness: complex R tibial plateau fracture and R tibial shaft fracture          Cognition  Cognition Arousal/Alertness: Awake/alert Behavior During Therapy: Anxious Overall Cognitive Status: Within Functional Limits for tasks assessed    Mobility  Bed Mobility Overal bed mobility: Needs Assistance Bed Mobility: Supine to Sit Supine to sit: Min assist General bed mobility comments: VC's for sequencing and technique with HOB flat and no bed rail use to simulate home environment. Assist for support of RLE during transition to EOB, however pt was able to move the RLE without help.  Transfers Overall transfer level: Needs assistance Equipment used: Rolling walker (2 wheeled) Transfers: Sit to/from Stand Sit to Stand: Mod assist;Min guard General transfer comment: Mod assist required to power-up to full stand first attempt. Min guard required to come to full stand on second attempt. VC's for hand placement on seated surface for safety.        Balance Balance Overall balance assessment: Needs assistance Sitting-balance support: Feet supported;Bilateral upper extremity supported Sitting balance-Leahy Scale: Fair Sitting balance - Comments: Pt sits to the L side to take weight off the R hip and LE.  Postural control: Left lateral lean Standing balance support: Bilateral upper extremity supported Standing balance-Leahy Scale: Fair  End of Session OT - End of Session Equipment Utilized During Treatment: Gait belt;Rolling walker Activity Tolerance: Patient tolerated treatment well Patient left: in chair;with call  bell/phone within reach Nurse Communication: Mobility status        Almon Register [phone] 08/29/2013, 2:22 PM

## 2013-08-30 LAB — BASIC METABOLIC PANEL
BUN: 28 mg/dL — AB (ref 6–23)
CHLORIDE: 98 meq/L (ref 96–112)
CO2: 18 meq/L — AB (ref 19–32)
CREATININE: 1.53 mg/dL — AB (ref 0.50–1.10)
Calcium: 8.5 mg/dL (ref 8.4–10.5)
GFR calc Af Amer: 38 mL/min — ABNORMAL LOW (ref >90)
GFR calc non Af Amer: 33 mL/min — ABNORMAL LOW (ref >90)
GLUCOSE: 135 mg/dL — AB (ref 70–99)
POTASSIUM: 5.6 meq/L — AB (ref 3.7–5.3)
Sodium: 135 mEq/L — ABNORMAL LOW (ref 137–147)

## 2013-08-30 LAB — CBC
HEMATOCRIT: 33.1 % — AB (ref 36.0–46.0)
Hemoglobin: 11.6 g/dL — ABNORMAL LOW (ref 12.0–15.0)
MCH: 30 pg (ref 26.0–34.0)
MCHC: 35 g/dL (ref 30.0–36.0)
MCV: 85.5 fL (ref 78.0–100.0)
Platelets: 295 10*3/uL (ref 150–400)
RBC: 3.87 MIL/uL (ref 3.87–5.11)
RDW: 13.3 % (ref 11.5–15.5)
WBC: 10.8 10*3/uL — ABNORMAL HIGH (ref 4.0–10.5)

## 2013-08-30 LAB — GLUCOSE, CAPILLARY: GLUCOSE-CAPILLARY: 119 mg/dL — AB (ref 70–99)

## 2013-08-30 MED ORDER — LEVOFLOXACIN 500 MG PO TABS: 500.0000 mg | ORAL_TABLET | Freq: Every day | ORAL | Status: AC

## 2013-08-30 MED ORDER — AMLODIPINE BESYLATE 5 MG PO TABS: 5.0000 mg | ORAL_TABLET | Freq: Every day | ORAL | Status: AC

## 2013-08-30 MED ORDER — GLIPIZIDE 5 MG PO TABS: 2.5000 mg | ORAL_TABLET | Freq: Every day | ORAL | Status: AC

## 2013-08-30 NOTE — Progress Notes (Signed)
Physical Therapy Treatment Patient Details Name: Lindsey Weber MRN: [id number] DOB: 1940-09-14 Today's Date: 08/30/2013 Time: 9163-8466 PT Time Calculation (min): 21 min  PT Assessment / Plan / Recommendation  History of Present Illness complex R tibial plateau fracture and R tibial shaft fracture   PT Comments   Pt progressing towards physical therapy goals. Demonstrated ability to perform safe transfers and stair negotiation with very minimal assist - able to be provided by husband at home. Pt refusing HHPT/HHOT at this time but has list to call if changes her mind. Pt expects to be d/c today.  Follow Up Recommendations  Home health PT     Does the patient have the potential to tolerate intense rehabilitation     Barriers to Discharge        Equipment Recommendations  Rolling walker with 5" wheels    Recommendations for Other Services    Frequency Min 5X/week   Progress towards PT Goals Progress towards PT goals: Progressing toward goals  Plan Current plan remains appropriate    Precautions / Restrictions Precautions Precautions: Fall Required Braces or Orthoses: Other Brace/Splint Other Brace/Splint: long leg splint - pt resplinted with posterior LLS and medial/lateral struts Restrictions Weight Bearing Restrictions: Yes RLE Weight Bearing: Non weight bearing   Pertinent Vitals/Pain Pt reports no pain throughout session.     Mobility  Bed Mobility Overal bed mobility: Needs Assistance Bed Mobility: Supine to Sit Supine to sit: Min guard General bed mobility comments: VC's for sequencing and technique, support for RLE while coming off bed to rest on floor. Transfers Overall transfer level: Needs assistance Equipment used: Rolling walker (2 wheeled) Transfers: Sit to/from Stand Sit to Stand: Min guard;Supervision General transfer comment: Pt demonstrated proper hand placement and safety awarenses.  Ambulation/Gait Ambulation/Gait assistance: Min guard Ambulation  Distance (Feet): 5 Feet (x2) Assistive device: Rolling walker (2 wheeled) Gait Pattern/deviations: Step-to pattern Gait velocity: Decreased Gait velocity interpretation: Below normal speed for age/gender General Gait Details: Pt only ambulating to get to step and w/c during session. Was able to maintain NWB well.  Stairs: Yes Stairs assistance: Min assist Stair Management: Backwards;With walker Number of Stairs: 1 General stair comments: VC's for sequencing and safety awareness, particularly with taking a big enough step onto the stair to allow for room for walker placement after.     Exercises     PT Diagnosis:    PT Problem List:   PT Treatment Interventions:     PT Goals (current goals can now be found in the care plan section) Acute Rehab PT Goals Patient Stated Goal: to go home, not to rehab PT Goal Formulation: With patient Time For Goal Achievement: 09/11/13 Potential to Achieve Goals: Good  Visit Information  Last PT Received On: 08/30/13 Assistance Needed: +1 History of Present Illness: complex R tibial plateau fracture and R tibial shaft fracture    Subjective Data  Subjective: "I'm ready to go home." Patient Stated Goal: to go home, not to rehab   Cognition  Cognition Arousal/Alertness: Awake/alert Behavior During Therapy: Anxious Overall Cognitive Status: Within Functional Limits for tasks assessed    Balance  Balance Overall balance assessment: Needs assistance Sitting-balance support: Feet supported;Bilateral upper extremity supported Sitting balance-Leahy Scale: Good Standing balance support: Bilateral upper extremity supported Standing balance-Leahy Scale: Fair  End of Session PT - End of Session Equipment Utilized During Treatment: Gait belt Activity Tolerance: Patient limited by fatigue Patient left: in chair;with call bell/phone within reach Nurse Communication: Mobility status  GP     Jolyn Lent 08/30/2013, 9:49 AM  Jolyn Lent,  PT, DPT [phone]

## 2013-08-30 NOTE — Consult Note (Signed)
[facility] PROGRESS NOTE  Lindsey Weber NFA:[id number] DOB: [birth year] DOA: 08/26/2013 PCP: [name], MD  Assessment/Plan: 73 year old white female history of diabetes, hypertension, suspected COPD/long-standing tobacco use, dyslipidemia was admitted to the orthopedic service last night following a fall and complex right tibial plateau and shaft fracture.  Hospitalists were consulted for medical management.  1. Right tibial fracture; defer to orthopedics    2. Suspected COPD/long-standing tobacco abuse; bronchitis; CXR: no clear infiltrate; but fever 2/4 -cont prn bronchodilators, levofloxacin added (d/c date 2/8 ); stop smoking; need OP PFTs   3. Diabetes mellitus; HA1C-7.1; d/c metformin with CKD, cont sliding scale insulin inpatient, glipizide upon discharge and f/u with PCP 1week   4. CKD II, Likely chronic no baseline labs, awoid nephrotoxic agents; UA with proteinuria, mild hyper K, recommended to check potassium next week with PCP   5. Carotid artery disease R; Start ASA, continue statin; FU with vascular   6. Hypertension increase amlodipine   7. Chronic CHF; echo:LVEF 46%, grade 1 diastolic dysfunction, AS with AVA 1.3, gradient 16 -echo: ? Mass lesion; TEE: No right atrial mass was identified. The structure seen on TTE is a very prominent lipomatous hypertrophy of the interatrial septum -patient seem to be euvolemic; cont monitor off diuretics; may need ACE outpatient eval   9. Acute on chronic anemia; no s/s of acute bleeding; recheck Hg, TFsed ; Hg stable   Thank you for the consult we will follow up as needed, d/c plans per primary   Code Status: full Family Communication: d/w aptient (indicate person spoken with, relationship, and if by phone, the number) Disposition Plan: pend clinical improvement    Consultants:  ortho  Procedures:  Pend surgery  Antibiotics:  none (indicate start date, and stop date if  known)  HPI/Subjective: alert  Objective: Filed Vitals:   08/30/13 0530  BP: 171/56  Pulse: 85  Temp: 98.7 F (37.1 C)  Resp: 18    Intake/Output Summary (Last 24 hours) at 08/30/13 0915 Last data filed at 08/29/13 2335  Gross per 24 hour  Intake    480 ml  Output   1800 ml  Net  -1320 ml   Filed Weights   08/27/13 0900  Weight: 70.308 kg (155 lb)    Exam:   General:  alert  Cardiovascular: s1,s2 rrr  Respiratory: CTA BL  Abdomen: soft, nt, nd   Musculoskeletal: leg warpped  Data Reviewed: Basic Metabolic Panel:  Recent Labs Lab 08/26/13 1245 08/27/13 1120 08/28/13 0540 08/29/13 0245 08/30/13 0345  NA 137 139 138 139 135*  K 4.5 5.1 4.3 3.6* 5.6*  CL 100 103 103 99 98  CO2 22 23 22 22  18*  GLUCOSE 142* 137* 133* 137* 135*  BUN 23 30* 24* 24* 28*  CREATININE 1.79* 2.17* 1.79* 1.59* 1.53*  CALCIUM 8.7 8.1* 7.9*  8.0* 8.4 8.5  PHOS  --  3.7  --   --   --    Liver Function Tests:  Recent Labs Lab 08/26/13 1245 08/27/13 1120  AST 17  --   ALT 14  --   ALKPHOS 74  --   BILITOT 0.3  --   PROT 6.4  --   ALBUMIN 3.1* 3.0*   No results found for this basename: LIPASE, AMYLASE,  in the last 168 hours No results found for this basename: AMMONIA,  in the last 168 hours CBC:  Recent Labs Lab 08/26/13 1005 08/26/13 1245 08/28/13 0540 08/29/13 0245 08/30/13 0345  WBC 12.8* 14.8*  10.4 12.0* 10.8*  NEUTROABS 10.6*  --   --   --   --   HGB 11.8* 11.1* 7.8* 11.6* 11.6*  HCT 34.1* 31.8* 23.0* 32.6* 33.1*  MCV 85.3 85.0 86.1 85.8 85.5  PLT 376 393 275 261 295   Cardiac Enzymes: No results found for this basename: CKTOTAL, CKMB, CKMBINDEX, TROPONINI,  in the last 168 hours BNP (last 3 results) No results found for this basename: PROBNP,  in the last 8760 hours CBG:  Recent Labs Lab 08/29/13 0632 08/29/13 1129 08/29/13 1616 08/29/13 2215 08/30/13 0620  GLUCAP 128* 115* 106* 123* 119*    Recent Results (from the past 240 hour(s))   URINE CULTURE     Status: None   Collection Time    08/26/13  1:08 PM      Result Value Range Status   Specimen Description URINE, CATHETERIZED   Final   Special Requests NONE   Final   Culture  Setup Time     Final   Value: 08/26/2013 20:10     Performed at [facility] Count     Final   Value: NO GROWTH     Performed at [facility]   Culture     Final   Value: NO GROWTH     Performed at [facility]   Report Status 08/27/2013 FINAL   Final  SURGICAL PCR SCREEN     Status: None   Collection Time    08/27/13  5:09 AM      Result Value Range Status   MRSA, PCR NEGATIVE  NEGATIVE Final   Staphylococcus aureus NEGATIVE  NEGATIVE Final   Comment:            The Xpert SA Assay (FDA     approved for NASAL specimens     in patients over 65 years of age),     is one component of     a comprehensive surveillance     program.  Test performance has     been validated by [employer] for patients greater     than or equal to 40 year old.     It is not intended     to diagnose infection nor to     guide or monitor treatment.     Studies: No results found.  Scheduled Meds: . amLODipine  5 mg Oral Daily  . aspirin  81 mg Oral Daily  . docusate sodium  100 mg Oral BID  . enoxaparin (LOVENOX) injection  30 mg Subcutaneous Q24H  . insulin aspart  0-15 Units Subcutaneous TID WC  . levofloxacin  250 mg Oral Daily  . ondansetron (ZOFRAN) IV  4 mg Intravenous Q8H  . simvastatin  5 mg Oral Daily   Continuous Infusions: . sodium chloride      Principal Problem:   Closed right tibial fracture Active Problems:   Tibial fracture   Peripheral vascular disease   Carotid stenosis, bilateral   COPD (chronic obstructive pulmonary disease)   Hyperlipemia   Hypertension   Diabetes mellitus without complication   Undiagnosed cardiac murmurs   Wheezing   Proteinuria   Elevated serum creatinine   Aortic valve stenosis    Time spent: >35  minutes     Kinnie Feil  [facility] Pager [phone]. If 7PM-7AM, please contact night-coverage at [email or web address], password [facility] 08/30/2013, 9:15 AM  LOS: 4 days

## 2013-09-01 LAB — VITAMIN D 1,25 DIHYDROXY
Vitamin D 1, 25 (OH)2 Total: 17 pg/mL — ABNORMAL LOW (ref 18–72)
Vitamin D2 1, 25 (OH)2: <8 pg/mL
Vitamin D3 1, 25 (OH)2: 17 pg/mL

## 2013-09-01 NOTE — Progress Notes (Signed)
09/01/13 Contacted Lindsey Weber at her home, she stated that she has decided that she does not want any home health care for physical therapy or an aide. Fuller Plan RN, BSN, CCM   08/29/13 Spoke with patient about [place] and gave her [facility] agency list for [facility]. Patient stated that she wanted to discuss decision with her daughter this evening. CM will continue to follow to set up [place]. Contacted Brent with [facility], they will deliver rolling walker and  wheelchair to patient's room this evening. Lindsey Flud states that she has a 3N1 at home.  Fuller Plan RN, BSN, CCM

## 2013-09-07 NOTE — Consult Note (Signed)
I saw and examined the patient with Mr. Lindsey Weber, communicating the findings and plan noted above.  Altamese Colfax, MD [facility] [phone] (p)

## 2013-09-07 NOTE — Progress Notes (Signed)
Too much swelling and now blisters.  Surgery postponed.  Altamese Amherst, MD [facility] [phone] (p)

## 2013-09-12 ENCOUNTER — Encounter (HOSPITAL_COMMUNITY): Payer: Self-pay | Admitting: *Deleted

## 2013-09-15 ENCOUNTER — Inpatient Hospital Stay (HOSPITAL_COMMUNITY)
Admission: RE | Admit: 2013-09-15 | Discharge: 2013-09-17 | DRG: 493 | Disposition: A | Discharge status: Home Health | Payer: Medicare PPO | Source: Ambulatory Visit | Attending: Orthopedic Surgery | Admitting: Orthopedic Surgery

## 2013-09-15 ENCOUNTER — Encounter (HOSPITAL_COMMUNITY): Payer: Self-pay | Admitting: Critical Care Medicine

## 2013-09-15 ENCOUNTER — Inpatient Hospital Stay (HOSPITAL_COMMUNITY): Payer: Medicare PPO

## 2013-09-15 ENCOUNTER — Encounter (HOSPITAL_COMMUNITY)
Admission: RE | Disposition: A | Discharge status: Home Health | Payer: Self-pay | Source: Ambulatory Visit | Attending: Orthopedic Surgery

## 2013-09-15 ENCOUNTER — Inpatient Hospital Stay (HOSPITAL_COMMUNITY): Payer: Medicare PPO | Admitting: Critical Care Medicine

## 2013-09-15 ENCOUNTER — Encounter (HOSPITAL_COMMUNITY): Payer: Medicare PPO | Admitting: Critical Care Medicine

## 2013-09-15 DIAGNOSIS — N189 Chronic kidney disease, unspecified: Secondary | ICD-10-CM | POA: Diagnosis present

## 2013-09-15 DIAGNOSIS — S82143A Displaced bicondylar fracture of unspecified tibia, initial encounter for closed fracture: Secondary | ICD-10-CM | POA: Diagnosis present

## 2013-09-15 DIAGNOSIS — E785 Hyperlipidemia, unspecified: Secondary | ICD-10-CM | POA: Diagnosis present

## 2013-09-15 DIAGNOSIS — Z79899 Other long term (current) drug therapy: Secondary | ICD-10-CM

## 2013-09-15 DIAGNOSIS — E119 Type 2 diabetes mellitus without complications: Secondary | ICD-10-CM | POA: Diagnosis present

## 2013-09-15 DIAGNOSIS — R7989 Other specified abnormal findings of blood chemistry: Secondary | ICD-10-CM | POA: Diagnosis present

## 2013-09-15 DIAGNOSIS — W108XXA Fall (on) (from) other stairs and steps, initial encounter: Secondary | ICD-10-CM | POA: Diagnosis present

## 2013-09-15 DIAGNOSIS — I10 Essential (primary) hypertension: Secondary | ICD-10-CM | POA: Diagnosis present

## 2013-09-15 DIAGNOSIS — F411 Generalized anxiety disorder: Secondary | ICD-10-CM | POA: Diagnosis present

## 2013-09-15 DIAGNOSIS — I129 Hypertensive chronic kidney disease with stage 1 through stage 4 chronic kidney disease, or unspecified chronic kidney disease: Secondary | ICD-10-CM | POA: Diagnosis present

## 2013-09-15 DIAGNOSIS — I6529 Occlusion and stenosis of unspecified carotid artery: Secondary | ICD-10-CM | POA: Diagnosis present

## 2013-09-15 DIAGNOSIS — I6523 Occlusion and stenosis of bilateral carotid arteries: Secondary | ICD-10-CM | POA: Diagnosis present

## 2013-09-15 DIAGNOSIS — J449 Chronic obstructive pulmonary disease, unspecified: Secondary | ICD-10-CM | POA: Diagnosis present

## 2013-09-15 DIAGNOSIS — I350 Nonrheumatic aortic (valve) stenosis: Secondary | ICD-10-CM | POA: Diagnosis present

## 2013-09-15 DIAGNOSIS — I658 Occlusion and stenosis of other precerebral arteries: Secondary | ICD-10-CM | POA: Diagnosis present

## 2013-09-15 DIAGNOSIS — I739 Peripheral vascular disease, unspecified: Secondary | ICD-10-CM | POA: Diagnosis present

## 2013-09-15 DIAGNOSIS — D62 Acute posthemorrhagic anemia: Secondary | ICD-10-CM | POA: Diagnosis not present

## 2013-09-15 DIAGNOSIS — N183 Chronic kidney disease, stage 3 unspecified: Secondary | ICD-10-CM | POA: Diagnosis present

## 2013-09-15 DIAGNOSIS — S82109A Unspecified fracture of upper end of unspecified tibia, initial encounter for closed fracture: Principal | ICD-10-CM | POA: Diagnosis present

## 2013-09-15 DIAGNOSIS — Z7982 Long term (current) use of aspirin: Secondary | ICD-10-CM

## 2013-09-15 DIAGNOSIS — K219 Gastro-esophageal reflux disease without esophagitis: Secondary | ICD-10-CM | POA: Diagnosis present

## 2013-09-15 DIAGNOSIS — N179 Acute kidney failure, unspecified: Secondary | ICD-10-CM | POA: Diagnosis present

## 2013-09-15 DIAGNOSIS — J4489 Other specified chronic obstructive pulmonary disease: Secondary | ICD-10-CM | POA: Diagnosis present

## 2013-09-15 DIAGNOSIS — J441 Chronic obstructive pulmonary disease with (acute) exacerbation: Secondary | ICD-10-CM | POA: Diagnosis present

## 2013-09-15 DIAGNOSIS — F17200 Nicotine dependence, unspecified, uncomplicated: Secondary | ICD-10-CM | POA: Diagnosis present

## 2013-09-15 DIAGNOSIS — S82209A Unspecified fracture of shaft of unspecified tibia, initial encounter for closed fracture: Secondary | ICD-10-CM | POA: Diagnosis present

## 2013-09-15 DIAGNOSIS — I359 Nonrheumatic aortic valve disorder, unspecified: Secondary | ICD-10-CM | POA: Diagnosis present

## 2013-09-15 HISTORY — PX: ORIF TIBIA PLATEAU: SHX2132

## 2013-09-15 HISTORY — DX: Anxiety disorder, unspecified: F41.9

## 2013-09-15 HISTORY — DX: Gastro-esophageal reflux disease without esophagitis: K21.9

## 2013-09-15 LAB — CBC WITH DIFFERENTIAL/PLATELET
BASOS ABS: 0.1 10*3/uL (ref 0.0–0.1)
BASOS PCT: 1 % (ref 0–1)
EOS ABS: 0.4 10*3/uL (ref 0.0–0.7)
Eosinophils Relative: 4 % (ref 0–5)
HCT: 33.6 % — ABNORMAL LOW (ref 36.0–46.0)
Hemoglobin: 11.1 g/dL — ABNORMAL LOW (ref 12.0–15.0)
Lymphocytes Relative: 16 % (ref 12–46)
Lymphs Abs: 1.7 10*3/uL (ref 0.7–4.0)
MCH: 29.4 pg (ref 26.0–34.0)
MCHC: 33 g/dL (ref 30.0–36.0)
MCV: 88.9 fL (ref 78.0–100.0)
MONOS PCT: 7 % (ref 3–12)
Monocytes Absolute: 0.8 10*3/uL (ref 0.1–1.0)
Neutro Abs: 8 10*3/uL — ABNORMAL HIGH (ref 1.7–7.7)
Neutrophils Relative %: 73 % (ref 43–77)
Platelets: 485 10*3/uL — ABNORMAL HIGH (ref 150–400)
RBC: 3.78 MIL/uL — ABNORMAL LOW (ref 3.87–5.11)
RDW: 13.6 % (ref 11.5–15.5)
WBC: 11 10*3/uL — ABNORMAL HIGH (ref 4.0–10.5)

## 2013-09-15 LAB — COMPREHENSIVE METABOLIC PANEL
ALK PHOS: 136 U/L — AB (ref 39–117)
ALT: 16 U/L (ref 0–35)
AST: 15 U/L (ref 0–37)
Albumin: 2.9 g/dL — ABNORMAL LOW (ref 3.5–5.2)
BILIRUBIN TOTAL: 0.4 mg/dL (ref 0.3–1.2)
BUN: 29 mg/dL — AB (ref 6–23)
CO2: 23 mEq/L (ref 19–32)
CREATININE: 1.51 mg/dL — AB (ref 0.50–1.10)
Calcium: 9.2 mg/dL (ref 8.4–10.5)
Chloride: 104 mEq/L (ref 96–112)
GFR calc Af Amer: 39 mL/min — ABNORMAL LOW (ref >90)
GFR calc non Af Amer: 33 mL/min — ABNORMAL LOW (ref >90)
Glucose, Bld: 132 mg/dL — ABNORMAL HIGH (ref 70–99)
POTASSIUM: 4.4 meq/L (ref 3.7–5.3)
Sodium: 142 mEq/L (ref 137–147)
TOTAL PROTEIN: 6.7 g/dL (ref 6.0–8.3)

## 2013-09-15 LAB — TYPE AND SCREEN
ABO/RH(D): O POS
ANTIBODY SCREEN: NEGATIVE

## 2013-09-15 LAB — GLUCOSE, CAPILLARY
GLUCOSE-CAPILLARY: 135 mg/dL — AB (ref 70–99)
GLUCOSE-CAPILLARY: 156 mg/dL — AB (ref 70–99)
Glucose-Capillary: 139 mg/dL — ABNORMAL HIGH (ref 70–99)
Glucose-Capillary: 113 mg/dL — ABNORMAL HIGH (ref 70–99)

## 2013-09-15 LAB — PROTIME-INR
INR: 0.98 (ref 0.00–1.49)
Prothrombin Time: 12.8 seconds (ref 11.6–15.2)

## 2013-09-15 LAB — APTT: aPTT: 34 seconds (ref 24–37)

## 2013-09-15 SURGERY — OPEN REDUCTION INTERNAL FIXATION (ORIF) TIBIAL PLATEAU
Anesthesia: General | Site: Leg Lower | Laterality: Right

## 2013-09-15 MED ORDER — POTASSIUM CHLORIDE IN NACL 20-0.9 MEQ/L-% IV SOLN
INTRAVENOUS | Status: DC
  Administered 2013-09-15: 22:00:00 via INTRAVENOUS
  Filled 2013-09-15 (×3): qty 1000

## 2013-09-15 MED ORDER — METOCLOPRAMIDE HCL 5 MG/ML IJ SOLN
INTRAMUSCULAR | Status: DC | PRN
  Administered 2013-09-15: 5 mg via INTRAVENOUS

## 2013-09-15 MED ORDER — CALCIUM CARBONATE 600 MG PO TABS
600.0000 mg | ORAL_TABLET | Freq: Two times a day (BID) | ORAL | Status: DC
  Filled 2013-09-15: qty 1

## 2013-09-15 MED ORDER — LACTATED RINGERS IV SOLN
INTRAVENOUS | Status: DC
  Administered 2013-09-15 (×2): via INTRAVENOUS

## 2013-09-15 MED ORDER — HYDROMORPHONE HCL PF 1 MG/ML IJ SOLN
INTRAMUSCULAR | Status: AC
  Administered 2013-09-15: 0.5 mg via INTRAVENOUS
  Filled 2013-09-15: qty 1

## 2013-09-15 MED ORDER — ADULT MULTIVITAMIN W/MINERALS CH
1.0000 | ORAL_TABLET | Freq: Every day | ORAL | Status: DC
  Administered 2013-09-16 – 2013-09-17 (×2): 1 via ORAL
  Filled 2013-09-15 (×2): qty 1

## 2013-09-15 MED ORDER — DEXAMETHASONE SODIUM PHOSPHATE 4 MG/ML IJ SOLN
INTRAMUSCULAR | Status: DC | PRN
Start: 2013-09-15 — End: 2013-09-15
  Administered 2013-09-15: 4 mg via INTRAVENOUS

## 2013-09-15 MED ORDER — ROCURONIUM BROMIDE 100 MG/10ML IV SOLN
INTRAVENOUS | Status: DC | PRN
  Administered 2013-09-15: 40 mg via INTRAVENOUS

## 2013-09-15 MED ORDER — OMEGA-3-ACID ETHYL ESTERS 1 G PO CAPS
1.0000 g | ORAL_CAPSULE | Freq: Every day | ORAL | Status: DC
  Administered 2013-09-16 – 2013-09-17 (×2): 1 g via ORAL
  Filled 2013-09-15 (×2): qty 1

## 2013-09-15 MED ORDER — ONDANSETRON HCL 4 MG/2ML IJ SOLN
INTRAMUSCULAR | Status: AC
  Filled 2013-09-15: qty 2

## 2013-09-15 MED ORDER — 0.9 % SODIUM CHLORIDE (POUR BTL) OPTIME
TOPICAL | Status: DC | PRN
  Administered 2013-09-15: 1000 mL

## 2013-09-15 MED ORDER — MIDAZOLAM HCL 5 MG/5ML IJ SOLN
INTRAMUSCULAR | Status: DC | PRN
  Administered 2013-09-15: 2 mg via INTRAVENOUS

## 2013-09-15 MED ORDER — PROPOFOL 10 MG/ML IV BOLUS
INTRAVENOUS | Status: AC
  Filled 2013-09-15: qty 20

## 2013-09-15 MED ORDER — MIDAZOLAM HCL 2 MG/2ML IJ SOLN
INTRAMUSCULAR | Status: AC
  Filled 2013-09-15: qty 2

## 2013-09-15 MED ORDER — OXYCODONE HCL 5 MG PO TABS
5.0000 mg | ORAL_TABLET | ORAL | Status: DC | PRN
  Administered 2013-09-15 (×2): 5 mg via ORAL
  Filled 2013-09-15 (×2): qty 1

## 2013-09-15 MED ORDER — MORPHINE SULFATE 2 MG/ML IJ SOLN
1.0000 mg | INTRAMUSCULAR | Status: DC | PRN
  Filled 2013-09-15: qty 1

## 2013-09-15 MED ORDER — VITAMIN B-12 100 MCG PO TABS
100.0000 ug | ORAL_TABLET | Freq: Every day | ORAL | Status: DC
  Administered 2013-09-16 – 2013-09-17 (×2): 100 ug via ORAL
  Filled 2013-09-15 (×2): qty 1

## 2013-09-15 MED ORDER — CEFAZOLIN SODIUM 1-5 GM-% IV SOLN
1.0000 g | Freq: Four times a day (QID) | INTRAVENOUS | Status: AC
  Administered 2013-09-15 – 2013-09-16 (×3): 1 g via INTRAVENOUS
  Filled 2013-09-15 (×4): qty 50

## 2013-09-15 MED ORDER — METOCLOPRAMIDE HCL 10 MG PO TABS: 5.0000 mg | ORAL_TABLET | Freq: Three times a day (TID) | ORAL | Status: DC | PRN

## 2013-09-15 MED ORDER — LIDOCAINE HCL (CARDIAC) 20 MG/ML IV SOLN
INTRAVENOUS | Status: AC
  Filled 2013-09-15: qty 5

## 2013-09-15 MED ORDER — DOCUSATE SODIUM 100 MG PO CAPS
100.0000 mg | ORAL_CAPSULE | Freq: Two times a day (BID) | ORAL | Status: DC
  Administered 2013-09-15 – 2013-09-17 (×4): 100 mg via ORAL
  Filled 2013-09-15 (×4): qty 1

## 2013-09-15 MED ORDER — NEOSTIGMINE METHYLSULFATE 1 MG/ML IJ SOLN
INTRAMUSCULAR | Status: AC
  Filled 2013-09-15: qty 10

## 2013-09-15 MED ORDER — LIDOCAINE HCL (CARDIAC) 20 MG/ML IV SOLN
INTRAVENOUS | Status: DC | PRN
  Administered 2013-09-15: 40 mg via INTRAVENOUS

## 2013-09-15 MED ORDER — HYDROMORPHONE HCL PF 1 MG/ML IJ SOLN
0.2500 mg | INTRAMUSCULAR | Status: DC | PRN
  Administered 2013-09-15 (×4): 0.5 mg via INTRAVENOUS

## 2013-09-15 MED ORDER — FENTANYL CITRATE 0.05 MG/ML IJ SOLN
INTRAMUSCULAR | Status: AC
  Filled 2013-09-15: qty 5

## 2013-09-15 MED ORDER — POLYETHYLENE GLYCOL 3350 17 G PO PACK
17.0000 g | PACK | Freq: Every day | ORAL | Status: DC
  Administered 2013-09-16 – 2013-09-17 (×2): 17 g via ORAL
  Filled 2013-09-15 (×3): qty 1

## 2013-09-15 MED ORDER — DEXTROSE 5 % IV SOLN
1000.0000 mg | Freq: Four times a day (QID) | INTRAVENOUS | Status: DC | PRN
  Filled 2013-09-15: qty 10

## 2013-09-15 MED ORDER — ASPIRIN 81 MG PO CHEW
81.0000 mg | CHEWABLE_TABLET | Freq: Every day | ORAL | Status: DC
  Administered 2013-09-16 – 2013-09-17 (×2): 81 mg via ORAL
  Filled 2013-09-15 (×3): qty 1

## 2013-09-15 MED ORDER — METHOCARBAMOL 500 MG PO TABS
500.0000 mg | ORAL_TABLET | Freq: Four times a day (QID) | ORAL | Status: DC | PRN
  Administered 2013-09-15: 500 mg via ORAL
  Filled 2013-09-15: qty 1

## 2013-09-15 MED ORDER — ALPRAZOLAM 0.5 MG PO TABS
0.5000 mg | ORAL_TABLET | ORAL | Status: DC | PRN
  Administered 2013-09-16: 0.5 mg via ORAL
  Filled 2013-09-15 (×2): qty 1

## 2013-09-15 MED ORDER — PHENYLEPHRINE HCL 10 MG/ML IJ SOLN
INTRAMUSCULAR | Status: DC | PRN
  Administered 2013-09-15: 80 ug via INTRAVENOUS

## 2013-09-15 MED ORDER — ONDANSETRON HCL 4 MG/2ML IJ SOLN
INTRAMUSCULAR | Status: DC | PRN
  Administered 2013-09-15 (×2): 4 mg via INTRAVENOUS

## 2013-09-15 MED ORDER — PROPOFOL 10 MG/ML IV BOLUS
INTRAVENOUS | Status: DC | PRN
  Administered 2013-09-15: 20 mg via INTRAVENOUS
  Administered 2013-09-15: 150 mg via INTRAVENOUS

## 2013-09-15 MED ORDER — SIMVASTATIN 5 MG PO TABS
5.0000 mg | ORAL_TABLET | Freq: Every day | ORAL | Status: DC
  Administered 2013-09-16 – 2013-09-17 (×2): 5 mg via ORAL
  Filled 2013-09-15 (×2): qty 1

## 2013-09-15 MED ORDER — PHENYLEPHRINE HCL 10 MG/ML IJ SOLN
10.0000 mg | INTRAVENOUS | Status: DC | PRN
  Administered 2013-09-15: 25 ug/min via INTRAVENOUS

## 2013-09-15 MED ORDER — OXYCODONE-ACETAMINOPHEN 5-325 MG PO TABS
1.0000 | ORAL_TABLET | ORAL | Status: DC | PRN
  Administered 2013-09-16 (×2): 2 via ORAL
  Filled 2013-09-15: qty 2
  Filled 2013-09-15: qty 1
  Filled 2013-09-15: qty 2

## 2013-09-15 MED ORDER — ROCURONIUM BROMIDE 50 MG/5ML IV SOLN
INTRAVENOUS | Status: AC
Start: 2013-09-15 — End: 2013-09-15
  Filled 2013-09-15: qty 1

## 2013-09-15 MED ORDER — NEOSTIGMINE METHYLSULFATE 1 MG/ML IJ SOLN
INTRAMUSCULAR | Status: DC | PRN
  Administered 2013-09-15: 3 mg via INTRAVENOUS

## 2013-09-15 MED ORDER — METHOCARBAMOL 100 MG/ML IJ SOLN
500.0000 mg | Freq: Four times a day (QID) | INTRAVENOUS | Status: DC | PRN
  Filled 2013-09-15: qty 5

## 2013-09-15 MED ORDER — ROCURONIUM BROMIDE 50 MG/5ML IV SOLN
INTRAVENOUS | Status: AC
  Filled 2013-09-15: qty 1

## 2013-09-15 MED ORDER — ALBUTEROL SULFATE (2.5 MG/3ML) 0.083% IN NEBU: 2.5000 mg | INHALATION_SOLUTION | RESPIRATORY_TRACT | Status: DC | PRN

## 2013-09-15 MED ORDER — ONDANSETRON HCL 4 MG/2ML IJ SOLN: 4.0000 mg | Freq: Once | INTRAMUSCULAR | Status: DC | PRN

## 2013-09-15 MED ORDER — ACETAMINOPHEN 500 MG PO TABS
1000.0000 mg | ORAL_TABLET | Freq: Once | ORAL | Status: AC
  Administered 2013-09-15: 1000 mg via ORAL
  Filled 2013-09-15: qty 2

## 2013-09-15 MED ORDER — ENOXAPARIN SODIUM 40 MG/0.4ML ~~LOC~~ SOLN
40.0000 mg | SUBCUTANEOUS | Status: DC
  Administered 2013-09-15 – 2013-09-16 (×2): 40 mg via SUBCUTANEOUS
  Filled 2013-09-15 (×3): qty 0.4

## 2013-09-15 MED ORDER — ONDANSETRON HCL 4 MG PO TABS: 4.0000 mg | ORAL_TABLET | Freq: Four times a day (QID) | ORAL | Status: DC | PRN

## 2013-09-15 MED ORDER — INSULIN ASPART 100 UNIT/ML ~~LOC~~ SOLN
0.0000 [IU] | Freq: Three times a day (TID) | SUBCUTANEOUS | Status: DC
  Administered 2013-09-16: 2 [IU] via SUBCUTANEOUS

## 2013-09-15 MED ORDER — PHENYLEPHRINE 40 MCG/ML (10ML) SYRINGE FOR IV PUSH (FOR BLOOD PRESSURE SUPPORT)
PREFILLED_SYRINGE | INTRAVENOUS | Status: AC
  Filled 2013-09-15: qty 10

## 2013-09-15 MED ORDER — GLYCOPYRROLATE 0.2 MG/ML IJ SOLN
INTRAMUSCULAR | Status: AC
  Filled 2013-09-15: qty 2

## 2013-09-15 MED ORDER — GLYCOPYRROLATE 0.2 MG/ML IJ SOLN
INTRAMUSCULAR | Status: DC | PRN
  Administered 2013-09-15: 0.4 mg via INTRAVENOUS

## 2013-09-15 MED ORDER — DEXAMETHASONE SODIUM PHOSPHATE 4 MG/ML IJ SOLN
INTRAMUSCULAR | Status: AC
  Filled 2013-09-15: qty 1

## 2013-09-15 MED ORDER — METOCLOPRAMIDE HCL 5 MG/ML IJ SOLN
INTRAMUSCULAR | Status: AC
  Filled 2013-09-15: qty 2

## 2013-09-15 MED ORDER — METOCLOPRAMIDE HCL 5 MG/ML IJ SOLN: 5.0000 mg | Freq: Three times a day (TID) | INTRAMUSCULAR | Status: DC | PRN

## 2013-09-15 MED ORDER — CALCIUM CARBONATE 1250 (500 CA) MG PO TABS
500.0000 mg | ORAL_TABLET | Freq: Two times a day (BID) | ORAL | Status: DC
  Administered 2013-09-16 – 2013-09-17 (×2): 500 mg via ORAL
  Filled 2013-09-15 (×5): qty 1

## 2013-09-15 MED ORDER — ACETAMINOPHEN 325 MG PO TABS
650.0000 mg | ORAL_TABLET | ORAL | Status: DC | PRN
  Administered 2013-09-15 – 2013-09-16 (×2): 650 mg via ORAL
  Filled 2013-09-15 (×2): qty 2

## 2013-09-15 MED ORDER — CEFAZOLIN SODIUM-DEXTROSE 2-3 GM-% IV SOLR
2.0000 g | INTRAVENOUS | Status: AC
Start: 2013-09-15 — End: 2013-09-15
  Administered 2013-09-15: 2 g via INTRAVENOUS
  Filled 2013-09-15: qty 50

## 2013-09-15 MED ORDER — ONDANSETRON HCL 4 MG/2ML IJ SOLN
4.0000 mg | Freq: Four times a day (QID) | INTRAMUSCULAR | Status: DC | PRN
  Administered 2013-09-15 – 2013-09-16 (×2): 4 mg via INTRAVENOUS
  Filled 2013-09-15 (×2): qty 2

## 2013-09-15 MED ORDER — FENTANYL CITRATE 0.05 MG/ML IJ SOLN
INTRAMUSCULAR | Status: DC | PRN
  Administered 2013-09-15 (×3): 50 ug via INTRAVENOUS
  Administered 2013-09-15: 100 ug via INTRAVENOUS

## 2013-09-15 SURGICAL SUPPLY — 98 items
BANDAGE ELASTIC 4 VELCRO ST LF (GAUZE/BANDAGES/DRESSINGS) ×3 IMPLANT
BANDAGE ELASTIC 6 VELCRO ST LF (GAUZE/BANDAGES/DRESSINGS) ×3 IMPLANT
BANDAGE ESMARK 6X9 LF (GAUZE/BANDAGES/DRESSINGS) ×1 IMPLANT
BANDAGE GAUZE ELAST BULKY 4 IN (GAUZE/BANDAGES/DRESSINGS) ×3 IMPLANT
BIT DRILL 100X2.5XANTM LCK (BIT) ×1 IMPLANT
BIT DRILL 3.5X5.5 QC CALB (BIT) ×3 IMPLANT
BIT DRILL CAL (BIT) ×1 IMPLANT
BIT DRL 100X2.5XANTM LCK (BIT) ×1
BLADE SURG 10 STRL SS (BLADE) ×3 IMPLANT
BLADE SURG 15 STRL LF DISP TIS (BLADE) ×1 IMPLANT
BLADE SURG 15 STRL SS (BLADE) ×3
BLADE SURG ROTATE 9660 (MISCELLANEOUS) IMPLANT
BNDG CMPR 9X6 STRL LF SNTH (GAUZE/BANDAGES/DRESSINGS) ×1
BNDG COHESIVE 4X5 TAN STRL (GAUZE/BANDAGES/DRESSINGS) ×3 IMPLANT
BNDG ESMARK 6X9 LF (GAUZE/BANDAGES/DRESSINGS) ×3
BNDG GAUZE ELAST 4 BULKY (GAUZE/BANDAGES/DRESSINGS) ×6 IMPLANT
BRUSH SCRUB DISP (MISCELLANEOUS) ×6 IMPLANT
CANISTER SUCT 3000ML (MISCELLANEOUS) ×3 IMPLANT
CLEANER TIP ELECTROSURG 2X2 (MISCELLANEOUS) ×3 IMPLANT
COVER MAYO STAND STRL (DRAPES) ×3 IMPLANT
COVER SURGICAL LIGHT HANDLE (MISCELLANEOUS) ×3 IMPLANT
DRAPE C-ARM 42X72 X-RAY (DRAPES) ×3 IMPLANT
DRAPE C-ARMOR (DRAPES) ×3 IMPLANT
DRAPE EXTREMITY T 121X128X90 (DRAPE) ×3 IMPLANT
DRAPE INCISE IOBAN 66X45 STRL (DRAPES) ×3 IMPLANT
DRAPE ORTHO SPLIT 77X108 STRL (DRAPES)
DRAPE SURG ORHT 6 SPLT 77X108 (DRAPES) IMPLANT
DRAPE U-SHAPE 47X51 STRL (DRAPES) ×3 IMPLANT
DRILL BIT 2.5MM (BIT) ×3
DRILL BIT CAL (BIT) ×3
DRSG ADAPTIC 3X8 NADH LF (GAUZE/BANDAGES/DRESSINGS) ×3 IMPLANT
DRSG PAD ABDOMINAL 8X10 ST (GAUZE/BANDAGES/DRESSINGS) ×12 IMPLANT
ELECT REM PT RETURN 9FT ADLT (ELECTROSURGICAL) ×3
ELECTRODE REM PT RTRN 9FT ADLT (ELECTROSURGICAL) ×1 IMPLANT
EVACUATOR 1/8 PVC DRAIN (DRAIN) IMPLANT
EVACUATOR 3/16  PVC DRAIN (DRAIN)
EVACUATOR 3/16 PVC DRAIN (DRAIN) IMPLANT
GLOVE BIO SURGEON STRL SZ7.5 (GLOVE) ×3 IMPLANT
GLOVE BIO SURGEON STRL SZ8 (GLOVE) ×3 IMPLANT
GLOVE BIOGEL PI IND STRL 7.5 (GLOVE) ×1 IMPLANT
GLOVE BIOGEL PI IND STRL 8 (GLOVE) ×1 IMPLANT
GLOVE BIOGEL PI INDICATOR 7.5 (GLOVE) ×2
GLOVE BIOGEL PI INDICATOR 8 (GLOVE) ×2
GOWN STRL REUS W/ TWL LRG LVL3 (GOWN DISPOSABLE) ×2 IMPLANT
GOWN STRL REUS W/ TWL XL LVL3 (GOWN DISPOSABLE) ×1 IMPLANT
GOWN STRL REUS W/TWL LRG LVL3 (GOWN DISPOSABLE) ×6
GOWN STRL REUS W/TWL XL LVL3 (GOWN DISPOSABLE) ×3
IMMOBILIZER KNEE 22 UNIV (SOFTGOODS) ×3 IMPLANT
K-WIRE ACE 1.6X6 (WIRE) ×3
KIT BASIN OR (CUSTOM PROCEDURE TRAY) ×3 IMPLANT
KIT ROOM TURNOVER OR (KITS) ×3 IMPLANT
KWIRE ACE 1.6X6 (WIRE) ×1 IMPLANT
NDL SUT 6 .5 CRC .975X.05 MAYO (NEEDLE) IMPLANT
NEEDLE 22X1 1/2 (OR ONLY) (NEEDLE) IMPLANT
NEEDLE MAYO TAPER (NEEDLE)
NS IRRIG 1000ML POUR BTL (IV SOLUTION) ×3 IMPLANT
PACK ORTHO EXTREMITY (CUSTOM PROCEDURE TRAY) ×3 IMPLANT
PAD ABD 8X10 STRL (GAUZE/BANDAGES/DRESSINGS) ×6 IMPLANT
PAD ARMBOARD 7.5X6 YLW CONV (MISCELLANEOUS) ×6 IMPLANT
PAD CAST 4YDX4 CTTN HI CHSV (CAST SUPPLIES) ×1 IMPLANT
PADDING CAST COTTON 4X4 STRL (CAST SUPPLIES) ×3
PADDING CAST COTTON 6X4 STRL (CAST SUPPLIES) ×3 IMPLANT
PENCIL BUTTON HOLSTER BLD 10FT (ELECTRODE) ×3 IMPLANT
PLATE LOCK 15H STD RT PROX TIB (Plate) ×3 IMPLANT
SCREW CORTICAL 3.5MM  28MM (Screw) ×2 IMPLANT
SCREW CORTICAL 3.5MM  30MM (Screw) ×2 IMPLANT
SCREW CORTICAL 3.5MM  46MM (Screw) ×2 IMPLANT
SCREW CORTICAL 3.5MM 26MM (Screw) ×3 IMPLANT
SCREW CORTICAL 3.5MM 28MM (Screw) ×1 IMPLANT
SCREW CORTICAL 3.5MM 30MM (Screw) ×1 IMPLANT
SCREW CORTICAL 3.5MM 46MM (Screw) ×1 IMPLANT
SCREW LOCK 3.5X75 DIST TIB (Screw) ×3 IMPLANT
SCREW LOCK CORT STAR 3.5X24 (Screw) ×3 IMPLANT
SCREW LOCK CORT STAR 3.5X70 (Screw) ×9 IMPLANT
SCREW LOCK CORT STAR 3.5X75 (Screw) ×12 IMPLANT
SPONGE GAUZE 4X4 12PLY (GAUZE/BANDAGES/DRESSINGS) ×3 IMPLANT
SPONGE LAP 18X18 X RAY DECT (DISPOSABLE) ×3 IMPLANT
STAPLER VISISTAT 35W (STAPLE) ×3 IMPLANT
STOCKINETTE IMPERVIOUS LG (DRAPES) ×3 IMPLANT
SUCTION FRAZIER TIP 10 FR DISP (SUCTIONS) ×3 IMPLANT
SUT ETHILON 3 0 PS 1 (SUTURE) ×9 IMPLANT
SUT PROLENE 0 CT 2 (SUTURE) ×6 IMPLANT
SUT VIC AB 0 CT1 27 (SUTURE) ×3
SUT VIC AB 0 CT1 27XBRD ANBCTR (SUTURE) ×1 IMPLANT
SUT VIC AB 1 CT1 27 (SUTURE) ×3
SUT VIC AB 1 CT1 27XBRD ANBCTR (SUTURE) ×1 IMPLANT
SUT VIC AB 2-0 CT1 27 (SUTURE) ×6
SUT VIC AB 2-0 CT1 TAPERPNT 27 (SUTURE) ×2 IMPLANT
SYR 20ML ECCENTRIC (SYRINGE) IMPLANT
TOWEL NATURAL 10PK STERILE (DISPOSABLE) ×3 IMPLANT
TOWEL NATURAL 6PK STERILE (DISPOSABLE) ×3 IMPLANT
TOWEL OR 17X24 6PK STRL BLUE (TOWEL DISPOSABLE) ×3 IMPLANT
TOWEL OR 17X26 10 PK STRL BLUE (TOWEL DISPOSABLE) ×6 IMPLANT
TRAY FOLEY CATH 16FRSI W/METER (SET/KITS/TRAYS/PACK) IMPLANT
TUBE CONNECTING 12'X1/4 (SUCTIONS) ×1
TUBE CONNECTING 12X1/4 (SUCTIONS) ×2 IMPLANT
WATER STERILE IRR 1000ML POUR (IV SOLUTION) ×6 IMPLANT
YANKAUER SUCT BULB TIP NO VENT (SUCTIONS) ×3 IMPLANT

## 2013-09-15 NOTE — Brief Op Note (Signed)
09/15/2013  2:45 PM  PATIENT:  Lindsey Weber  73 y.o. female  PRE-OPERATIVE DIAGNOSIS:  BICONDYLAR TIBIAL PLATEAU AND SHAFT FRACTURE RIGHT   POST-OPERATIVE DIAGNOSIS:  BICONDYLAR TIBIAL PLATEAU AND SHAFT FRACTURE RIGHT   PROCEDURE:  Procedure(s): OPEN REDUCTION INTERNAL FIXATION (ORIF) RIGHT TIBIAL PLATEAU AND SHAFT  (Right)  SURGEON:  Surgeon(s) and Role:    * Rozanna Box, MD - Primary  PHYSICIAN ASSISTANT: Ainsley Spinner, PA-C  ANESTHESIA:   general  I/O:  Total I/O In: 1000 [I.V.:1000] Out: 50 [Blood:50]  SPECIMEN:  No Specimen  TOURNIQUET:    DICTATION: .Other Dictation: Dictation Number [id number]

## 2013-09-15 NOTE — Anesthesia Preprocedure Evaluation (Addendum)
Anesthesia Evaluation  Patient identified by MRN, date of birth, ID band Patient awake    Reviewed: Allergy & Precautions, H&P , NPO status , Patient's Chart, lab work & pertinent test results  History of Anesthesia Complications (+) PONV and history of anesthetic complications  Airway Mallampati: II TM Distance: >3 FB Neck ROM: Full    Dental  (+) Dental Advisory Given, Edentulous Upper, Edentulous Lower   Pulmonary COPD COPD inhaler, Current Smoker,          Cardiovascular hypertension, Pt. on medications + Peripheral Vascular Disease     Neuro/Psych PSYCHIATRIC DISORDERS Anxiety    GI/Hepatic GERD-  Medicated and Controlled,  Endo/Other  diabetes, Oral Hypoglycemic Agents  Renal/GU      Musculoskeletal   Abdominal   Peds  Hematology   Anesthesia Other Findings   Reproductive/Obstetrics                         Anesthesia Physical Anesthesia Plan  ASA: III  Anesthesia Plan: General   Post-op Pain Management:    Induction: Intravenous  Airway Management Planned: Oral ETT  Additional Equipment:   Intra-op Plan:   Post-operative Plan: Extubation in OR  Informed Consent: I have reviewed the patients History and Physical, chart, labs and discussed the procedure including the risks, benefits and alternatives for the proposed anesthesia with the patient or authorized representative who has indicated his/her understanding and acceptance.   Dental advisory given  Plan Discussed with: Surgeon and CRNA  Anesthesia Plan Comments:        Anesthesia Quick Evaluation

## 2013-09-15 NOTE — H&P (View-Only) (Signed)
I saw and examined the patient with Mr. Eddie Dibbles, communicating the findings and plan noted above.  Altamese Iroquois, MD [facility] [phone] (p)

## 2013-09-15 NOTE — Preoperative (Signed)
Beta Blockers   Reason not to administer Beta Blockers:Not Applicable 

## 2013-09-15 NOTE — Transfer of Care (Signed)
Immediate Anesthesia Transfer of Care Note  Patient: Lindsey Weber  Procedure(s) Performed: Procedure(s): OPEN REDUCTION INTERNAL FIXATION (ORIF) RIGHT TIBIAL PLATEAU AND SHAFT  (Right)  Patient Location: PACU  Anesthesia Type:General  Level of Consciousness: awake, alert  and oriented  Airway & Oxygen Therapy: Patient Spontanous Breathing and Patient connected to nasal cannula oxygen  Post-op Assessment: Report given to PACU RN and Post -op Vital signs reviewed and stable  Post vital signs: Reviewed and stable  Complications: No apparent anesthesia complications

## 2013-09-15 NOTE — Progress Notes (Signed)
Orthopedic Tech Progress Note Patient Details:  Lindsey Weber 09/02/1940 [id number]  Ortho Devices Ortho Device/Splint Location: trapeze bar patient helper Ortho Device/Splint Interventions: Application   Hildred Priest 09/15/2013, 8:54 PM

## 2013-09-15 NOTE — H&P (View-Only) (Signed)
Orthopaedic Trauma Service consult  Reason: complex R tibial plateau fracture and R tibial shaft fracture Requesting: [name], MD (Ortho)  Pt seen and evaluated with [name]  PCP: [name], MD  HPI:    Patient is a 73 year old white female, with history of hypertension, diabetes, hyperlipidemia who sustained a fall at home yesterday afternoon while going downstairs. She lost her footing in her right leg went under her. She had immediate onset of pain in her right leg with deformity but was able to get up stairs. She was brought to [facility] for evaluation where she was found to have a complex right tibial plateau and tibial shaft fracture. Patient was initially seen and evaluated by Dr. Rolena Infante of orthopedics but given the complexity of the injury the orthopedic trauma service was consult and regarding her injury. Patient was eventually transferred over to [place] about 10:00 last night. The orthopedic trauma services consultation on her this morning.  Currently patient is in 5 N. room to complains of some right leg pain and nausea and vomiting. Denies any pain elsewhere. Pain is exacerbated with movement. It is relieved with rest elevation and pain medicine. She does report some numbness to the dorsum of her right foot but is able to move her toes without increased pain or difficulty. Patient denies any additional injuries.  Patient is ambulatory without any assistive devices prior to this fall.  Past medical history  Hypertension  Hyperlipidemia  Diabetes , On metformin  Vascular disease, Carotid ultrasound 2014 showed bilateral carotid artery stenosis with stenosis more pronounced in the right side approximately 50-69% occlusion, do not appreciate the patient has been evaluated by vascular surgery  Past surgical history  Partial hysterectomy  Bladder tacking  Family history  Noncontributory  Social history  Patient is a current everyday smoker smokes about  half pack per day has done so since the age of 60  No alcohol use  Patient lives in [place]  No assistive devices prior to this acute injury, ambulatory without any difficulties  Medications prior to admission  Aspirin, Xanax, Norvasc, Os-Cal, vitamin B12, coenzyme Q10, multivitamin, metformin, omega-3's, simvastatin  Allergies  No known drug allergies  Review of Systems  Constitutional: Negative for fever and chills.  Eyes: Negative for blurred vision.  Respiratory: Negative for shortness of breath.   Cardiovascular: Negative for chest pain and palpitations.  Gastrointestinal: Positive for nausea and vomiting. Negative for abdominal pain.  Genitourinary: Negative for dysuria.  Musculoskeletal:       Right leg pain  Neurological: Positive for sensory change. Negative for headaches.    Physical exam  BP 151/49  Pulse 86  Temp(Src) 99 F (37.2 C) (Oral)  Resp 18  SpO2 95%   Physical Exam  Constitutional: She is oriented to person, place, and time. Vital signs are normal. She appears well-developed and well-nourished. She is cooperative.  Pleasant, answers all questions appropriately  HENT:  Head: Normocephalic and atraumatic.  Mouth/Throat: Mucous membranes are dry.  Neck: Normal range of motion and full passive range of motion without pain. Neck supple.  Cardiovascular:  S1 and S2, systolic murmur appreciated  Pulmonary/Chest:  Wheezing bilaterally, right greater than left  Abdominal:  Soft, nontender, nondistended No palpable masses appreciated + Bowel sounds  Musculoskeletal:  Pelvis    No instability or pain with AP or lateral compression  Bilateral upper extremities   No acute findings  Motor and sensory functions grossly intact   Palpable peripheral pulses  Full active and passive range of motion of all joints     Left lower extremity    No acute findings    Full Range of motion the hip, knee, ankle and foot   Motor and sensory functions intact     Peripheral pulses are difficult to ascertain but I am able to palpate a dorsalis pedis, 1+. Unable to appreciate posterior tibialis  Right lower extremity    Hip is unremarkable, no pain with axial loading or gentle logrolling.   Patient is in a long leg posterior splint that extends from mid thigh to the foot. There is a very nice bulky dressing and Ace wrap applied to the lower extremity.   The dressing was split partially to evaluate her soft tissue at her foot and ankle as well as proximally at the tibial plateau region.    There is fairly substantial swelling present. Skin does not wrinkles with gentle compression on the area of the tibial plateau. There also some fracture blisters present in the area that was examined.    Moderate swelling is noted distally to the foot as well.    Unable to appreciate dorsalis pedis or posterior tibial pulses but her extremities warm with satisfactory capillary refill   Decreased deep peroneal nerve sensory function   SPN and TN sensory functions are grossly intact   EHL, FHL and lesser toe motor functions are intact.   Compartments are full but compressible without pain. No pain with passive stretching   Ligamentous exam of the knee and ankle not performed due to splinting    Neurological: She is alert and oriented to person, place, and time.  Skin: Skin is warm and intact.  Psychiatric: She has a normal mood and affect. Her speech is normal and behavior is normal. Thought content normal.     Labs  Labs are from 08/26/2013  Results for RILEA, ARUTYUNYAN (MRN [id number]) as of 08/27/2013 09:45  Ref. Range 08/26/2013 13:08  Color, Urine Latest Range: YELLOW  YELLOW  APPearance Latest Range: CLEAR  CLOUDY (A)  Specific Gravity, Urine Latest Range: 1.005-1.030  1.015  pH Latest Range: 5.0-8.0  5.5  Glucose Latest Range: NEGATIVE mg/dL NEGATIVE  Bilirubin Urine Latest Range: NEGATIVE  NEGATIVE  Ketones, ur Latest Range: NEGATIVE mg/dL NEGATIVE  Protein  Latest Range: NEGATIVE mg/dL >300 (A)  Urobilinogen, UA Latest Range: 0.0-1.0 mg/dL 0.2  Nitrite Latest Range: NEGATIVE  NEGATIVE  Leukocytes, UA Latest Range: NEGATIVE  NEGATIVE  Hgb urine dipstick Latest Range: NEGATIVE  NEGATIVE  Urine-Other No range found AMORPHOUS URATES/PHOSPHATES  WBC, UA Latest Range: <3 WBC/hpf 0-2  Squamous Epithelial / LPF Latest Range: RARE  RARE  Bacteria, UA Latest Range: RARE  RARE  Casts Latest Range: NEGATIVE  GRANULAR CAST (A)    Results for NATASA, STIGALL (MRN [id number]) as of 08/27/2013 09:45  Ref. Range 08/26/2013 12:45  Sodium Latest Range: 137-147 mEq/L 137  Potassium Latest Range: 3.7-5.3 mEq/L 4.5  Chloride Latest Range: 96-112 mEq/L 100  CO2 Latest Range: 19-32 mEq/L 22  BUN Latest Range: 6-23 mg/dL 23  Creatinine Latest Range: 0.50-1.10 mg/dL 1.79 (H)  Calcium Latest Range: 8.4-10.5 mg/dL 8.7  GFR calc non Af Amer Latest Range: >90 mL/min 27 (L)  GFR calc Af Amer Latest Range: >90 mL/min 31 (L)  Glucose Latest Range: 70-99 mg/dL 142 (H)  Alkaline Phosphatase Latest Range: 39-117 U/L 74  Albumin Latest Range: 3.5-5.2 g/dL 3.1 (L)  AST Latest Range: 0-37 U/L 17  ALT Latest Range: 0-35 U/L 14  Total Protein Latest Range: 6.0-8.3 g/dL 6.4  Total Bilirubin Latest Range: 0.3-1.2 mg/dL 0.3  WBC Latest Range: 4.0-10.5 K/uL 14.8 (H)  RBC Latest Range: 3.87-5.11 MIL/uL 3.74 (L)  Hemoglobin Latest Range: 12.0-15.0 g/dL 11.1 (L)  HCT Latest Range: 36.0-46.0 % 31.8 (L)  MCV Latest Range: 78.0-100.0 fL 85.0  MCH Latest Range: 26.0-34.0 pg 29.7  MCHC Latest Range: 30.0-36.0 g/dL 34.9  RDW Latest Range: 11.5-15.5 % 12.8  Platelets Latest Range: 150-400 K/uL 393  Prothrombin Time Latest Range: 11.6-15.2 seconds 12.4  INR Latest Range: 0.00-1.49  0.94  APTT Latest Range: 24-37 seconds 32    Imaging  Plain films and CT scan right tibia and knee  Complex right tibial shaft and tibial plateau fractures. Mild joint comminution present but joint  surface itself looks well maintained and preserved. There is extensive comminution through the metadiaphyseal region as well as the diaphysis of the right tibial shaft. There is also a coronal fragment of the tibial plateau as well with some mild displacement.    Assessment and plan    73 year old white female status post fall with complex right tibial shaft and tibial plateau fractures.    1. Complex right tibial plateau fracture with right tibial shaft fracture  This is quite a remarkable fracture from such a low energy mechanism. Patient will need to have some type of workup for her bone density. This will take place during this hospitalization and will likely continue as an outpatient. She would benefit from a DEXA scan as an outpatient as well.  Patient will need surgical intervention to address this fracture. Patient ideally would benefit from a bridge plating technique however we do think that early surgical intervention is most appropriate for this patient and can consider IM nailing. Her swelling at this point with acute any type of plating and even at its current state patient will still require another 2 days of soft tissue rest with aggressive ice and elevation to get some of her swelling to subside before proceeding to the OR for intramedullary nailing.   Patient will be nonweightbearing for 6-8 weeks postoperatively again the knee joint involvement as well as extensive comminution of her shaft. She'll have unrestricted range of motion of her right knee postoperatively as well.   For the time being patient can be nonweightbearing with therapies and transfer to and from her chair as necessary.   Continue with aggressive ice and elevation to level the heart to help with swelling control and resolution   Patient is encouraged to move her toes as much possible to help with swelling control as well   2. wheezing right> left  Xopenex inhaler times one now and will schedule one to 2 puffs  every 6 hours as needed  Continue with incentive spirometry  3. systolic murmur  Will order an echo and this needs to be completed before we can proceed to the OR  Stat EKG as well  4. elevated creatinine/proteinuria  Patient was noted to have some protein in her urine and wonder if this is related to chronic kidney insufficiency related to her diabetes and vascular disease. Elevated creatinine can also be do to some dehydration and her metformin use. We will discontinue her metformin for now and continue on SSI for diabetes control   will continue with gentle IV hydration  Repeat renal function panel today  5. chronic medical issues including hypertension, hyperlipidemia, and diabetes  Continue Norvasc and simvastatin  Hold metformin as noted above  SSI and carbohydrate modified diet  6. DVT and PE prophylaxis  Lovenox for now  Discontinue aspirin  SCD to left leg  7. Pain  Continue with IV Dilaudid for severe pain  Will add oral Norco 5-325, one to 2 every 6 hours as needed for pain  Robaxin for muscle spasms  8. nicotine dependence  No nicotine products during her healing phase secondary to negative effects on wound and bone healing   9. Disposition  Continue with admission for observation and pain control and OR on Friday for ORIF versus IM nail of her right tibial plateau and tibial shaft fractures.   Clinically I do feel that the patient is a somewhat sicker than what she leads on to be given the presence of elevated creatinine, wheezing, systolic murmur (which patient has never been informed of before in the past ), carotid artery stenosis and other peripheral vascular disease as evidenced by calcifications noted on plain x-rays . With all of these factors present We'll obtain a internal medicine consult so that they can follow and assist with management of this patient  Jari Pigg, PA-C [facility] [phone] (P) 08/27/2013 10:25 AM

## 2013-09-15 NOTE — Anesthesia Procedure Notes (Signed)
Procedure Name: Intubation Date/Time: 09/15/2013 12:47 PM Performed by: Carola Frost Pre-anesthesia Checklist: Patient identified, Timeout performed, Emergency Drugs available, Suction available and Patient being monitored Patient Re-evaluated:Patient Re-evaluated prior to inductionOxygen Delivery Method: Circle system utilized Preoxygenation: Pre-oxygenation with 100% oxygen Intubation Type: IV induction Ventilation: Mask ventilation without difficulty and Oral airway inserted - appropriate to patient size Laryngoscope Size: Mac and 3 Grade View: Grade I Tube type: Oral Tube size: 7.0 mm Number of attempts: 1 Airway Equipment and Method: Stylet Placement Confirmation: positive ETCO2,  ETT inserted through vocal cords under direct vision and breath sounds checked- equal and bilateral Secured at: 21 cm Tube secured with: Tape Dental Injury: Teeth and Oropharynx as per pre-operative assessment

## 2013-09-15 NOTE — Progress Notes (Signed)
Call to Dr. Carlean Jews office, spoke with [facility] & asked that MD put orders into EPIC.

## 2013-09-15 NOTE — Interval H&P Note (Signed)
History and Physical Interval Note:  Please see dc note and consult note No interval changes  Pt seen at office, soft tissues stable for ORIF Surgery was originally planned for last week but delayed due to inclement weather Presents today for ORIF R tibial plateau     09/15/2013 9:42 AM  Cherlynn Perches  has presented today for surgery, with the diagnosis of TIBIAL PLATEAU SHAFT FRACTURE RIGHT   The various methods of treatment have been discussed with the patient and family. After consideration of risks, benefits and other options for treatment, the patient has consented to  Procedure(s): OPEN REDUCTION INTERNAL FIXATION (ORIF) RIGHT TIBIAL PLATEAU AND SHAFT  (Right) as a surgical intervention .  The patient's history has been reviewed, patient examined, no change in status, stable for surgery.  I have reviewed the patient's chart and labs.  Questions were answered to the patient's satisfaction.     Jari Pigg

## 2013-09-16 ENCOUNTER — Encounter (HOSPITAL_COMMUNITY): Payer: Self-pay | Admitting: Orthopedic Surgery

## 2013-09-16 LAB — CBC
HCT: 31 % — ABNORMAL LOW (ref 36.0–46.0)
Hemoglobin: 10.2 g/dL — ABNORMAL LOW (ref 12.0–15.0)
MCH: 29.6 pg (ref 26.0–34.0)
MCHC: 32.9 g/dL (ref 30.0–36.0)
MCV: 89.9 fL (ref 78.0–100.0)
PLATELETS: 496 10*3/uL — AB (ref 150–400)
RBC: 3.45 MIL/uL — AB (ref 3.87–5.11)
RDW: 13.7 % (ref 11.5–15.5)
WBC: 13.6 10*3/uL — AB (ref 4.0–10.5)

## 2013-09-16 LAB — BASIC METABOLIC PANEL
BUN: 28 mg/dL — ABNORMAL HIGH (ref 6–23)
CALCIUM: 9 mg/dL (ref 8.4–10.5)
CO2: 25 mEq/L (ref 19–32)
Chloride: 103 mEq/L (ref 96–112)
Creatinine, Ser: 1.6 mg/dL — ABNORMAL HIGH (ref 0.50–1.10)
GFR, EST AFRICAN AMERICAN: 36 mL/min — AB (ref >90)
GFR, EST NON AFRICAN AMERICAN: 31 mL/min — AB (ref >90)
GLUCOSE: 99 mg/dL (ref 70–99)
Potassium: 5.4 mEq/L — ABNORMAL HIGH (ref 3.7–5.3)
SODIUM: 142 meq/L (ref 137–147)

## 2013-09-16 LAB — GLUCOSE, CAPILLARY
GLUCOSE-CAPILLARY: 95 mg/dL (ref 70–99)
GLUCOSE-CAPILLARY: 135 mg/dL — AB (ref 70–99)
Glucose-Capillary: 124 mg/dL — ABNORMAL HIGH (ref 70–99)
Glucose-Capillary: 128 mg/dL — ABNORMAL HIGH (ref 70–99)

## 2013-09-16 LAB — HEMOGLOBIN A1C
Hgb A1c MFr Bld: 6.1 % — ABNORMAL HIGH (ref <5.7)
MEAN PLASMA GLUCOSE: 128 mg/dL — AB (ref <117)

## 2013-09-16 MED ORDER — SODIUM CHLORIDE 0.9 % IV SOLN
INTRAVENOUS | Status: DC
  Administered 2013-09-16: 50 mL/h via INTRAVENOUS

## 2013-09-16 MED ORDER — PANTOPRAZOLE SODIUM 40 MG PO TBEC
40.0000 mg | DELAYED_RELEASE_TABLET | Freq: Every day | ORAL | Status: DC
  Administered 2013-09-16 – 2013-09-17 (×2): 40 mg via ORAL
  Filled 2013-09-16 (×2): qty 1

## 2013-09-16 NOTE — Care Management Note (Signed)
CARE MANAGEMENT NOTE 09/16/2013  Patient:  Lindsey Weber, Lindsey Weber   Account Number:  [id number]  Date Initiated:  09/16/2013  Documentation initiated by:  Ricki Miller  Subjective/Objective Assessment:   73 yr old female s/p ORIF of left Tibia plateau fracture     Action/Plan:   Case Manager spoke with patient concerning home health and DME needs at discharge. Patient states she doesnt want [place] and has rolling walker and 3in1, wheelchair, no needs, husband will  assist at discharge. CM signing off.   Anticipated DC Date:  09/16/2013   Anticipated DC Plan:  HOME/SELF CARE      DC Planning Services  CM consult      Choice offered to / List presented to:  C-1 Patient   DME arranged  NA        [place] arranged  NA      Status of service:  Completed, signed off Medicare Important Message given?   (If response is "NO", the following Medicare IM given date fields will be blank) Date Medicare IM given:   Date Additional Medicare IM given:    Discharge Disposition:  HOME/SELF CARE  Per UR Regulation:    If discussed at Long Length of Stay Meetings, dates discussed:    Comments:

## 2013-09-16 NOTE — Progress Notes (Signed)
Orthopaedic Trauma Service Progress Note  Subjective  Doing great Worked with PT already and walked some Sitting in chair watching TV now Voiding w/o difficulty No other complaints Pain controlled   Review of Systems  Constitutional: Negative for fever and chills.  Respiratory: Negative for shortness of breath.   Cardiovascular: Negative for chest pain and palpitations.  Gastrointestinal: Negative for nausea, vomiting and abdominal pain.  Genitourinary: Negative for dysuria.  Neurological: Negative for tingling, sensory change and headaches.     Objective   BP 146/50  Pulse 74  Temp(Src) 97.6 F (36.4 C) (Oral)  Resp 18  Ht 5\' 7"  (1.702 m)  Wt 68.096 kg (150 lb 2 oz)  BMI 23.51 kg/m2  SpO2 95%  Intake/Output     02/23 0701 - 02/24 0700 02/24 0701 - 02/25 0700   P.O.  240   I.V. (mL/kg) 1110 (16.3)    IV Piggyback 50    Total Intake(mL/kg) 1160 (17) 240 (3.5)   Blood 50    Total Output 50     Net +1110 +240        Urine Occurrence 2 x      Labs  Cbc pending  Results for Lindsey Weber, CRISP (MRN [id number]) as of 09/16/2013 10:56  Ref. Range 09/16/2013 04:34  Sodium Latest Range: 137-147 mEq/L 142  Potassium Latest Range: 3.7-5.3 mEq/L 5.4 (H)  Chloride Latest Range: 96-112 mEq/L 103  CO2 Latest Range: 19-32 mEq/L 25  BUN Latest Range: 6-23 mg/dL 28 (H)  Creatinine Latest Range: 0.50-1.10 mg/dL 1.60 (H)  Calcium Latest Range: 8.4-10.5 mg/dL 9.0  GFR calc non Af Amer Latest Range: >90 mL/min 31 (L)  GFR calc Af Amer Latest Range: >90 mL/min 36 (L)  Glucose Latest Range: 70-99 mg/dL 99     Exam  Gen: awake and alert, NAD, comfortable Lungs: clear anterior fields Cardiac: s1 and s2, systolic murmur, reg Abd: soft, NTND, + BS Ext:       Right Lower Extremity   Dressing c/d/i  DPN, SPN, TN sensation intact  EHL, FHL, AT, PT, peroneals, gastroc motor intact  + DP pulse  No DCT  Compartments soft and NT  No pain with passive stretch   Swelling stable      Assessment and Plan   POD/HD#: 1   73 y/o female s/p ORIF R tibial plateau and shaft fracture  1. Complex R tibial plateau and shaft fractures:  NWB x 8 weeks  Unrestricted ROM R knee  Ice and elevated  Dressing change tomorrow  PT/OT   HEP  Order hinge knee brace- unlocked   Total knee precautions    2. Pain management:  Continue with current regmien  3. ABL anemia/Hemodynamics  Stable   Cbc pending  4. DVT/PE prophylaxis:  lovenox x 3 weeks  5. ID:   Completed periop abx prophylaxis   6. Activity:  As tolerated while maintaining WB restrictions  7. FEN/Foley/Lines:   diet as tolerated  KVO IVF   8. Dispo:  Plan for dc home tomorrow if continuing to progress well     Jari Pigg, PA-C [facility] [phone] (P) 09/16/2013 10:55 AM

## 2013-09-16 NOTE — Evaluation (Signed)
Physical Therapy Evaluation Patient Details Name: Lindsey Weber MRN: [id number] DOB: 22-Nov-1940 Today's Date: 09/16/2013 Time: 1000-1027 PT Time Calculation (min): 27 min  PT Assessment / Plan / Recommendation History of Present Illness  Complex R tibial plateau fracture and R tibial shaft fracture weeks ago. Pt is now s/p ORIF tibial plateau and is NWB.  Clinical Impression  This patient presents with acute pain and decreased functional independence following the above mentioned procedure. At the time of PT eval, pt was able to perform transfers and ambulation well with minimal cues for safety. This patient is appropriate for skilled PT interventions to address functional limitations, improve safety and independence with functional mobility, and return to PLOF.     PT Assessment  Patient needs continued PT services    Follow Up Recommendations  Home health PT    Does the patient have the potential to tolerate intense rehabilitation      Barriers to Discharge        Equipment Recommendations  Rolling walker with 5" wheels    Recommendations for Other Services     Frequency Min 5X/week    Precautions / Restrictions Precautions Precautions: Fall Restrictions Weight Bearing Restrictions: Yes RLE Weight Bearing: Non weight bearing   Pertinent Vitals/Pain Pt reports minimal pain throughout session and did not give a pain rating on 0-10 scale when asked.       Mobility  Bed Mobility Overal bed mobility: Needs Assistance Bed Mobility: Supine to Sit Supine to sit: Min assist General bed mobility comments: VC's for sequencing and technique, support for RLE while coming off bed to rest on floor. Transfers Overall transfer level: Needs assistance Equipment used: Rolling walker (2 wheeled) Transfers: Sit to/from [facility] Sit to Stand: Min guard Stand pivot transfers: Min assist General transfer comment: Pt cued for hand placement on seated surface for  safety. SPT to [facility] with assist for walker placement.  Ambulation/Gait Ambulation/Gait assistance: Min guard Ambulation Distance (Feet): 20 Feet Assistive device: Rolling walker (2 wheeled) Gait Pattern/deviations:  (Hop-to) Gait velocity: Decreased Gait velocity interpretation: Below normal speed for age/gender General Gait Details: Able to maintain NWB status well.     Exercises General Exercises - Lower Extremity Ankle Circles/Pumps: 10 reps (toe wiggles) Quad Sets: 10 reps   PT Diagnosis: Difficulty walking;Acute pain  PT Problem List: Decreased strength;Decreased range of motion;Decreased activity tolerance;Decreased balance;Decreased mobility;Decreased knowledge of use of DME;Decreased safety awareness;Decreased knowledge of precautions;Pain PT Treatment Interventions: DME instruction;Gait training;Stair training;Functional mobility training;Therapeutic activities;Therapeutic exercise;Neuromuscular re-education;Patient/family education     PT Goals(Current goals can be found in the care plan section) Acute Rehab PT Goals Patient Stated Goal: to go home, not to rehab PT Goal Formulation: With patient Time For Goal Achievement: 09/30/13 Potential to Achieve Goals: Good  Visit Information  Last PT Received On: 09/16/13 Assistance Needed: +1 History of Present Illness: Complex R tibial plateau fracture and R tibial shaft fracture weeks ago. Pt is now s/p ORIF tibial plateau and is NWB.       Prior [place] expects to be discharged to:: Private residence Living Arrangements: Spouse/significant other Available Help at Discharge: Family;Available 24 hours/day Type of Home: House Home Access: Stairs to enter [employer] of Steps: 2 Entrance Stairs-Rails: None Home Layout: One level Home Equipment: Wheelchair - manual Prior Function Level of Independence: Independent Communication Communication: No difficulties Dominant Hand: Right     Cognition  Cognition Arousal/Alertness: Awake/alert Behavior During Therapy: Anxious Overall Cognitive Status: Within  Functional Limits for tasks assessed    Extremity/Trunk Assessment Upper Extremity Assessment Upper Extremity Assessment: Defer to OT evaluation Lower Extremity Assessment Lower Extremity Assessment: RLE deficits/detail RLE Deficits / Details: Decreased strength and AROM consistent with above mentioned procedure.  RLE: Unable to fully assess due to pain;Unable to fully assess due to immobilization Cervical / Trunk Assessment Cervical / Trunk Assessment: Kyphotic   Balance Balance Overall balance assessment: Needs assistance Sitting-balance support: Feet supported;Bilateral upper extremity supported Sitting balance-Leahy Scale: Fair Standing balance support: Bilateral upper extremity supported Standing balance-Leahy Scale: Poor  End of Session PT - End of Session Equipment Utilized During Treatment: Gait belt Activity Tolerance: Patient tolerated treatment well;Other (comment) (Limited by dizziness during ambulation) Patient left: in chair;with call bell/phone within reach Nurse Communication: Mobility status  GP     Jolyn Lent 09/16/2013, 12:40 PM  Jolyn Lent, [place], DPT [phone]

## 2013-09-16 NOTE — Anesthesia Postprocedure Evaluation (Signed)
  Anesthesia Post-op Note  Patient: Lindsey Weber  Procedure(s) Performed: Procedure(s): OPEN REDUCTION INTERNAL FIXATION (ORIF) RIGHT TIBIAL PLATEAU AND SHAFT  (Right)  Patient Location: PACU  Anesthesia Type:General  Level of Consciousness: awake, oriented, sedated and patient cooperative  Airway and Oxygen Therapy: Patient Spontanous Breathing  Post-op Pain: mild  Post-op Assessment: Post-op Vital signs reviewed, Patient's Cardiovascular Status Stable, Respiratory Function Stable, Patent Airway, No signs of Nausea or vomiting and Pain level controlled  Post-op Vital Signs: stable  Complications: No apparent anesthesia complications

## 2013-09-16 NOTE — Progress Notes (Signed)
Orthopedic Tech Progress Note Patient Details:  Lindsey Weber 09-29-1940 [id number] Advanced called for brace order Patient ID: Lindsey Weber, female   DOB: 1941-03-05, 73 y.o.   MRN: [id number]   [place] Foy 09/16/2013, 11:15 AM

## 2013-09-16 NOTE — Evaluation (Signed)
Occupational Therapy Evaluation Patient Details Name: Lindsey Weber MRN: [id number] DOB: 03-10-1941 Today's Date: 09/16/2013 Time: 5284-1324 OT Time Calculation (min): 21 min  OT Assessment / Plan / Recommendation History of present illness Complex R tibial plateau fracture and R tibial shaft fracture weeks ago. Pt is now s/p ORIF tibial plateau and is NWB.   Clinical Impression   Pt demos decline in function and safety with ADLs and ADL mobility safety and would benefit from acute OT services to address inapparents to increase level of function to return home safely. Pt declined HH OT and has toilet riser with handles, shower seat, reacher and RW at home. Pt's husband will be able to assist her 24/7    OT Assessment  Patient needs continued OT Services    Follow Up Recommendations  Home health OT;Supervision/Assistance - 24 hour    Barriers to Discharge  none    Equipment Recommendations   pt declined 3 in 1   Recommendations for Other Services    Frequency  Min 2X/week    Precautions / Restrictions Precautions Precautions: Fall Required Braces or Orthoses: Other Brace/Splint Other Brace/Splint: long leg splint - pt resplinted with posterior LLS and medial/lateral struts Restrictions Weight Bearing Restrictions: Yes RLE Weight Bearing: Non weight bearing   Pertinent Vitals/Pain 2/10 R LE    ADL  Grooming: Performed;Set up Where Assessed - Grooming: Unsupported sitting Upper Body Bathing: Simulated;Set up Where Assessed - Upper Body Bathing: Unsupported sitting Lower Body Bathing: Moderate assistance;Simulated Upper Body Dressing: Performed;Set up Where Assessed - Upper Body Dressing: Unsupported sitting Lower Body Dressing: Simulated;Maximal assistance;+1 Total assistance Toilet Transfer: Performed;Min guard;Minimal assistance Toilet Transfer Method: Sit to stand;Stand pivot Toilet Transfer Equipment: Bedside commode Toileting - Clothing Manipulation and Hygiene:  Performed;Minimal assistance Where Assessed - Best boy and Hygiene: Lean right and/or left Tub/Shower Transfer Method: Not assessed Equipment Used: Gait belt;Rolling walker;Other (comment) (BSC) Transfers/Ambulation Related to ADLs: Pt cued for hand placement on seated surface for safety. SPT to [facility] with assist for walker placement.  ADL Comments: Pt states that husband will A her with any ADLs  that she needs A with. Pt has a reacher at home and is aware of other ADL A/E, but states that she is not interested in it. Reviewed LB dressing technique using reacher    OT Diagnosis: Acute pain  OT Problem List: Decreased range of motion;Impaired balance (sitting and/or standing);Pain;Decreased knowledge of use of DME or AE OT Treatment Interventions: Self-care/ADL training;Patient/family education;Balance training;DME and/or AE instruction   OT Goals(Current goals can be found in the care plan section) Acute Rehab OT Goals Patient Stated Goal: to go home OT Goal Formulation: With patient Time For Goal Achievement: 09/23/13 Potential to Achieve Goals: Good ADL Goals Pt Will Perform Lower Body Bathing: with min assist;with caregiver independent in assisting;sitting/lateral leans;sit to/from stand Pt Will Perform Lower Body Dressing: with max assist;with mod assist;with caregiver independent in assisting;sitting/lateral leans;sit to/from stand Pt Will Transfer to Toilet: with supervision;bedside commode;regular height toilet;grab bars (3 in 1 over toilet) Pt Will Perform Toileting - Clothing Manipulation and hygiene: with min guard assist;with supervision;sitting/lateral leans  Visit Information  Last OT Received On: 09/16/13 Assistance Needed: +1 History of Present Illness: Complex R tibial plateau fracture and R tibial shaft fracture weeks ago. Pt is now s/p ORIF tibial plateau and is NWB.       Prior Functioning     Home Living Family/patient expects to be  discharged to:: Private residence Living Arrangements:  Spouse/significant other Available Help at Discharge: Family;Available 24 hours/day Type of Home: House Home Access: Stairs to enter [employer] of Steps: 2 Entrance Stairs-Rails: None Home Layout: One level Home Equipment: Wheelchair - manual;Toilet riser;Hand held shower head;Shower seat;Crutches;Walker - 2 wheels Prior Function Level of Independence: Independent Communication Communication: No difficulties Dominant Hand: Right         Vision/Perception Vision - History Baseline Vision: Wears glasses all the time Patient Visual Report: No change from baseline Perception Perception: Within Functional Limits   Cognition  Cognition Arousal/Alertness: Awake/alert Behavior During Therapy: WFL for tasks assessed/performed Overall Cognitive Status: Within Functional Limits for tasks assessed    Extremity/Trunk Assessment Upper Extremity Assessment Upper Extremity Assessment: Overall WFL for tasks assessed Lower Extremity Assessment Lower Extremity Assessment: Defer to PT evaluation Cervical / Trunk Assessment Cervical / Trunk Assessment: Kyphotic     Mobility Bed Mobility Overal bed mobility: Needs Assistance Bed Mobility: Supine to Sit;Sit to Supine Supine to sit: Min guard Sit to supine: Min guard General bed mobility comments: VC's for sequencing and technique, support for RLE while coming off bed to rest on floor. Transfers Overall transfer level: Needs assistance Equipment used: Rolling walker (2 wheeled) Transfers: Sit to/from [facility] Sit to Stand: Min guard Stand pivot transfers: Min guard General transfer comment: Pt cued for hand placement on seated surface for safety. SPT to [facility] with assist for walker placement.           Balance Balance Overall balance assessment: Needs assistance Sitting-balance support: No upper extremity supported;Feet supported Sitting  balance-Leahy Scale: Good Standing balance support: Bilateral upper extremity supported;Single extremity supported;During functional activity Standing balance-Leahy Scale: Poor   End of Session OT - End of Session Equipment Utilized During Treatment: Gait belt;Rolling walker;Other (comment) (BSC) Activity Tolerance: Patient tolerated treatment well Patient left: with call bell/phone within reach;in bed  GO     Britt Bottom 09/16/2013, 4:07 PM

## 2013-09-16 NOTE — Op Note (Signed)
NAMEMARLEA, GAMBILL                ACCOUNT NO.:  [id number]  MEDICAL RECORD NO.:  [id number]  LOCATION:  5N10C                        FACILITY:  [place]  PHYSICIAN:  Astrid Divine. Marcelino Scot, M.D. DATE OF BIRTH:  May 15, 1941  DATE OF PROCEDURE:  09/15/2013 DATE OF DISCHARGE:                              OPERATIVE REPORT   PREOPERATIVE DIAGNOSES: 1. Right bicondylar tibial plateau fracture. 2. Right tibial shaft fracture.  POSTOPERATIVE DIAGNOSES: 1. Right bicondylar tibial plateau fracture. 2. Right tibial shaft fracture.  PROCEDURES: 1. Open reduction and internal fixation bicondylar tibial plateau. 2. Open reduction and internal fixation, tibial shaft.  SURGEON:  Astrid Divine. Marcelino Scot, MD  ASSISTANT:  Jari Pigg, PA-C  ANESTHESIA:  General.  COMPLICATIONS:  None.  TOURNIQUET:  None.  DISPOSITION:  To PACU.  CONDITION:  Stable.  BRIEF SUMMARY AND INDICATION FOR PROCEDURE:  Lindsey Weber is a very pleasant 73 year old female who sustained a ground level fall resulting in a rather severely comminuted bicondylar tibial plateau and tibial shaft fracture.  Because of the severe soft tissue swelling, she underwent a period of long-leg splinting and dressing changes.  She had extensive blisters up and down her leg from the knee to the ankle.  I discussed with her preoperative risks and benefits of surgery including possibility of infection, nerve injury, vessel injury, DVT, PE, heart attack, stroke, arthritis, loss of motion, need for further surgery, including removal of symptomatic hardware, total knee arthroplasty.  The patient understood these risks quite clearly and did wish to proceed.  BRIEF SUMMARY OF PROCEDURE:  The patient received preoperative antibiotics, taken to the operating room, a general anesthesia was induced.  Her right lower extremity was prepped and draped in usual sterile fashion.  There was extensive eschar from the knee down to the leg complicating the  surgical approach.  Approximately, a lateral incision was made enabling knee to enter the anterior compartment and insert the longest available proximal tibia plate.  I made an auxiliary incision medially through which I introduced the [employer] clamp with a foot on it given her anticipated low-bone quality.  Using the proximal plate laterally and the Erma Pinto was used to compress across the bicondylar fracture site and this was quite effective in closing down this space radiographically.  The plateau was then pinned provisionally and secured with multiple lateral to medial screws through the plate proximally.  Once the plateau was secured, tensions were then directed distally.  I had checked the plate for position regard to anterior to posterior, but there was still considerable comminution through the shaft and this was initially reduced and improved by placing a 3.5 lag screw at the apex of the deformity over drilling near cortex with a 3.5 screw and securing the far cortex of the tibial shaft fragment with the thread tips.  This was sequentially tightened down reducing the malalignment. This was then followed by placement of the 4 distal screws securing the tibial shaft in a well-aligned and reduced position.  There remain multiple fractures with some areas of small displacement, however, overall the alignment of the plateau to the distal shaft was appropriate.  Wounds were irrigated thoroughly and they were checked  on multiple AP and lateral x-ray projections.  This showed appropriate position trajectory and length.  Ainsley Spinner, PA-C assisted me throughout, was necessary for both the plateau reduction fixation in the shaft.  He produced traction and manipulated the length reduction during provisional and definitive fixation.  He did assist with both removal of provisional and placement of some definitive fixation while I was assisting with alignment and retraction as well.  He also  assisted with wound closure.  PROGNOSES:  Lindsey Weber will be a nonweightbearing with unrestricted range of motion of the knee and ankle.  We will have her on DVT prophylaxis and we will carefully follow her fracture to make sure she goes on to uneventful union given this biomechanical construct.     Astrid Divine. Marcelino Scot, M.D.     MHH/MEDQ  D:  09/15/2013  T:  09/16/2013  Job:  [id number]

## 2013-09-17 DIAGNOSIS — N183 Chronic kidney disease, stage 3 unspecified: Secondary | ICD-10-CM | POA: Diagnosis present

## 2013-09-17 DIAGNOSIS — N179 Acute kidney failure, unspecified: Secondary | ICD-10-CM | POA: Diagnosis present

## 2013-09-17 LAB — BASIC METABOLIC PANEL
BUN: 26 mg/dL — ABNORMAL HIGH (ref 6–23)
CHLORIDE: 105 meq/L (ref 96–112)
CO2: 26 mEq/L (ref 19–32)
CREATININE: 1.59 mg/dL — AB (ref 0.50–1.10)
Calcium: 9.3 mg/dL (ref 8.4–10.5)
GFR calc Af Amer: 36 mL/min — ABNORMAL LOW (ref >90)
GFR calc non Af Amer: 31 mL/min — ABNORMAL LOW (ref >90)
Glucose, Bld: 107 mg/dL — ABNORMAL HIGH (ref 70–99)
POTASSIUM: 5.3 meq/L (ref 3.7–5.3)
SODIUM: 144 meq/L (ref 137–147)

## 2013-09-17 LAB — CBC
HEMATOCRIT: 29.6 % — AB (ref 36.0–46.0)
HEMOGLOBIN: 9.8 g/dL — AB (ref 12.0–15.0)
MCH: 29.7 pg (ref 26.0–34.0)
MCHC: 33.1 g/dL (ref 30.0–36.0)
MCV: 89.7 fL (ref 78.0–100.0)
Platelets: 462 10*3/uL — ABNORMAL HIGH (ref 150–400)
RBC: 3.3 MIL/uL — ABNORMAL LOW (ref 3.87–5.11)
RDW: 13.6 % (ref 11.5–15.5)
WBC: 10.2 10*3/uL (ref 4.0–10.5)

## 2013-09-17 LAB — GLUCOSE, CAPILLARY
GLUCOSE-CAPILLARY: 101 mg/dL — AB (ref 70–99)
Glucose-Capillary: 102 mg/dL — ABNORMAL HIGH (ref 70–99)

## 2013-09-17 MED ORDER — OXYCODONE HCL 5 MG PO TABS: 5.0000 mg | ORAL_TABLET | ORAL | Status: DC | PRN

## 2013-09-17 MED ORDER — CHOLECALCIFEROL 25 MCG (1000 UT) PO CAPS: 1000.0000 [IU] | ORAL_CAPSULE | Freq: Every day | ORAL | Status: AC

## 2013-09-17 MED ORDER — AMLODIPINE BESYLATE 5 MG PO TABS
5.0000 mg | ORAL_TABLET | Freq: Every day | ORAL | Status: DC
  Administered 2013-09-17: 5 mg via ORAL
  Filled 2013-09-17: qty 1

## 2013-09-17 MED ORDER — ENOXAPARIN SODIUM 30 MG/0.3ML ~~LOC~~ SOLN: 30.0000 mg | SUBCUTANEOUS | Status: DC

## 2013-09-17 NOTE — Progress Notes (Signed)
Physical Therapy Treatment Patient Details Name: Lindsey Weber MRN: [id number] DOB: 1940-09-30 Today's Date: 09/17/2013 Time: 1135-1150 PT Time Calculation (min): 15 min  PT Assessment / Plan / Recommendation  History of Present Illness pt reports she is ready to go home   PT Comments   Pt states she is comfortable with NWB mobility since she was doing it before surgery. She said her doctor explained to her how to something to keep her foot in neutral. Also suggested to patient that she fold a pillow to take up the space between her footboard and foot to keep her foot in midline while she is resting.  Follow Up Recommendations  Home health PT     Does the patient have the potential to tolerate intense rehabilitation     Barriers to Discharge        Equipment Recommendations       Recommendations for Other Services    Frequency     Progress towards PT Goals Progress towards PT goals: Progressing toward goals  Plan Current plan remains appropriate    Precautions / Restrictions Precautions Precautions: Fall Required Braces or Orthoses: Other Brace/Splint Other Brace/Splint: long leg hinged brace Restrictions Weight Bearing Restrictions: Yes RLE Weight Bearing: Non weight bearing   Pertinent Vitals/Pain C/o minor pain in ankle    Mobility  Bed Mobility Overal bed mobility: Needs Assistance Bed Mobility: Supine to Sit;Sit to Supine Supine to sit: Supervision Sit to supine: Supervision Transfers Overall transfer level: Needs assistance Equipment used: Rolling walker (2 wheeled) Transfers: Stand Pivot Transfers Sit to Stand: Supervision;Min guard General transfer comment: Pt does not reach back with arms to control descent to bed.  Pt states 'That's how I do it" Ambulation/Gait Ambulation/Gait assistance: Modified independent (Device/Increase time) Ambulation Distance (Feet): 20 Feet Assistive device: Rolling walker (2 wheeled) General Gait Details: Able to maintain  NWB status well.     Exercises Other Exercises Other Exercises: right ankle dorsiflexion and eversion.  Other Exercises: toe flexion and extension   PT Diagnosis:    PT Problem List:   PT Treatment Interventions:     PT Goals (current goals can now be found in the care plan section) Acute Rehab PT Goals Patient Stated Goal: to go home  Visit Information  Last PT Received On: 09/17/13 Assistance Needed: +1 History of Present Illness: pt reports she is ready to go home    Subjective Data  Patient Stated Goal: to go home   Cognition  Cognition Arousal/Alertness: Awake/alert Behavior During Therapy: WFL for tasks assessed/performed Overall Cognitive Status: Within Functional Limits for tasks assessed    Balance  Balance Standing balance support: Single extremity supported;Bilateral upper extremity supported;During functional activity Standing balance-Leahy Scale: Fair  End of Session PT - End of Session Activity Tolerance: Patient tolerated treatment well Patient left: in bed Nurse Communication: Mobility status   GP    Donato Heinz. [place], [place] 09/17/2013, 1:07 PM

## 2013-09-17 NOTE — Progress Notes (Signed)
Occupational Therapy Treatment Patient Details Name: Lindsey Weber MRN: [id number] DOB: 09-Dec-1940 Today's Date: 09/17/2013 Time: 8413-2440 OT Time Calculation (min): 11 min  OT Assessment / Plan / Recommendation  History of present illness Complex R tibial plateau fracture and R tibial shaft fracture weeks ago. Pt is now s/p ORIF tibial plateau and is NWB.   OT comments  Pt making good progress with functional goals and should continue with acute OT services to increase level of function to return home safely  Follow Up Recommendations  Supervision/Assistance - 24 hour;Other (comment);Home health OT (pt declines HH)    Barriers to Discharge   none    Equipment Recommendations  None recommended by OT    Recommendations for Other Services    Frequency Min 2X/week   Progress towards OT Goals Progress towards OT goals: Progressing toward goals  Plan Discharge plan remains appropriate    Precautions / Restrictions Precautions Precautions: Fall Required Braces or Orthoses: Other Brace/Splint Other Brace/Splint: long leg splint - pt resplinted with posterior LLS and medial/lateral struts Restrictions Weight Bearing Restrictions: Yes RLE Weight Bearing: Non weight bearing   Pertinent Vitals/Pain 2/10 R LE    ADL  Grooming: Performed;Supervision/safety;Min guard Where Assessed - Grooming: Supported standing Lower Body Bathing: Simulated;Minimal assistance;Moderate assistance Lower Body Dressing: Performed;Moderate assistance Toilet Transfer: Performed;Supervision/safety;Min Psychiatric nurse Method: Sit to stand Toilet Transfer Equipment: Raised toilet seat with arms (or 3-in-1 over toilet) Toileting - Clothing Manipulation and Hygiene: Performed;Min guard Where Assessed - Camera operator Manipulation and Hygiene: Standing Equipment Used: Gait belt;Rolling walker;Other (comment) (3 in 1 over toilet)    OT Diagnosis:    OT Problem List:   OT Treatment Interventions:      OT Goals(current goals can now be found in the care plan section) Acute Rehab OT Goals Patient Stated Goal: to go home  Visit Information  Last OT Received On: 09/17/13 Assistance Needed: +1 History of Present Illness: Complex R tibial plateau fracture and R tibial shaft fracture weeks ago. Pt is now s/p ORIF tibial plateau and is NWB.                 Cognition  Cognition Arousal/Alertness: Awake/alert Behavior During Therapy: WFL for tasks assessed/performed Overall Cognitive Status: Within Functional Limits for tasks assessed    Mobility  Bed Mobility Overal bed mobility: Needs Assistance Bed Mobility: Supine to Sit;Sit to Supine Supine to sit: Supervision Sit to supine: Supervision Transfers Overall transfer level: Needs assistance Equipment used: Rolling walker (2 wheeled) Transfers: Sit to/from Stand Sit to Stand: Supervision;Min guard General transfer comment: pt able to maintain NWB of R LE          Balance Balance Standing balance support: Single extremity supported;Bilateral upper extremity supported;During functional activity Standing balance-Leahy Scale: Fair  End of Session OT - End of Session Equipment Utilized During Treatment: Gait belt;Rolling walker;Other (comment) (3 in 1 over toilet) Activity Tolerance: Patient tolerated treatment well Patient left: with call bell/phone within reach;in bed  GO     Lindsey Weber 09/17/2013, 12:02 PM

## 2013-09-17 NOTE — Discharge Summary (Signed)
Orthopaedic Trauma Service (OTS)  Patient ID: Lindsey Weber MRN: [id number] DOB/AGE: 1941/02/02 73 y.o.  Admit date: 09/15/2013 Discharge date: 09/17/2013  Admission Diagnoses: Complex right tibial plateau fracture Right tibial shaft fracture PVD Bilateral carotid stenosis COPD Hyperlipidemia Hypertension Diabetes CKD with elevated creatinine Aortic valve stenosis  Discharge Diagnoses:  Active Problems:   Peripheral vascular disease   Carotid stenosis, bilateral   COPD (chronic obstructive pulmonary disease)   Hyperlipemia   Hypertension   Diabetes mellitus without complication   Elevated serum creatinine   Aortic valve stenosis   Tibial plateau fracture   Procedures Performed: 09/15/2013- [name] 1. Open reduction and internal fixation bicondylar tibial plateau.  2. Open reduction and internal fixation, tibial shaft.   Discharged Condition: good  Hospital Course:    73 year old white female well known to the orthopedic trauma service after sustaining a closed right tibial plateau and tibial shaft fracture. Date of initial injury was 08/26/2013. Patient was admitted at that time for her right tibial shaft and tibial plateau fracture. On physical exam she was found to have new onset murmur and had extensive workup with TEE as well as transthoracic echo which was significant for aortic stenosis. Due to her soft tissue swelling and fracture blisters we were unable to perform early ORIF of her right tibial fractures. Patient was discharged on 08/30/2013 in a long-leg splint as well as a bulky dressing. She followed up in our office about a week later for evaluation of her soft tissue and was told that her swelling had resolved enough to proceed with open reduction internal fixation. Patient was taken to the operating room on 09/15/2013 for ORIF of a complex right bicondylar tibial plateau fracture as well as ORIF of a right tibial shaft fracture. After surgery patient  was transferred to the PACU for recovery from anesthesia and was taken to the orthopedic floor for continued observation and pain control. The patient did very well during this hospitalization and really did not have any acute issues. On postoperative day #1 she was doing very well with well-controlled pain. She began to work with therapy and was doing very well. No other issues were noted. She was started on Lovenox for DVT and PE prophylaxis following surgery again. She was on Lovenox preop as well. Patient was also fitted for a hinged knee brace to begin range of motion of her right knee. On postoperative day #2 patient was doing fantastic well-controlled pain. She was tolerating diet and voiding without any difficulty. She was passing gas and did not have any belly pain. No other complaints were noted. She was mobilizing well with therapy and felt stable enough to return home. On postoperative day #2 patient was deemed to be in stable condition for discharge to home with home health.   Consults: None  Significant Diagnostic Studies: labs:  Results for Lindsey Weber, Lindsey Weber (MRN [id number]) as of 09/17/2013 09:37  Ref. Range 09/17/2013 04:30  Sodium Latest Range: 137-147 mEq/L 144  Potassium Latest Range: 3.7-5.3 mEq/L 5.3  Chloride Latest Range: 96-112 mEq/L 105  CO2 Latest Range: 19-32 mEq/L 26  BUN Latest Range: 6-23 mg/dL 26 (H)  Creatinine Latest Range: 0.50-1.10 mg/dL 1.59 (H)  Calcium Latest Range: 8.4-10.5 mg/dL 9.3  GFR calc non Af Amer Latest Range: >90 mL/min 31 (L)  GFR calc Af Amer Latest Range: >90 mL/min 36 (L)  Glucose Latest Range: 70-99 mg/dL 107 (H)  WBC Latest Range: 4.0-10.5 K/uL 10.2  RBC Latest  Range: 3.87-5.11 MIL/uL 3.30 (L)  Hemoglobin Latest Range: 12.0-15.0 g/dL 9.8 (L)  HCT Latest Range: 36.0-46.0 % 29.6 (L)  MCV Latest Range: 78.0-100.0 fL 89.7  MCH Latest Range: 26.0-34.0 pg 29.7  MCHC Latest Range: 30.0-36.0 g/dL 33.1  RDW Latest Range: 11.5-15.5 % 13.6  Platelets  Latest Range: 150-400 K/uL 462 (H)     Treatments: IV hydration, antibiotics: Ancef, analgesia: acetaminophen and oxycodone and Percocet, anticoagulation: LMW heparin, therapies: PT, OT and RN and surgery: As above  Discharge Exam:       Orthopaedic Trauma Service Progress Note  Subjective  Doing well Pain controlled Ready to go home No issues Voiding w/o difficulty + Flatus  BP elevated- restart home meds today   Review of Systems  Constitutional: Negative for fever and chills.  Respiratory: Negative for shortness of breath and wheezing.   Cardiovascular: Negative for chest pain and palpitations.  Gastrointestinal: Negative for nausea, vomiting and abdominal pain.  Genitourinary: Negative for dysuria.  Neurological: Negative for tingling and sensory change.    Objective   BP 184/58  Pulse 88  Temp(Src) 97.9 F (36.6 C) (Oral)  Resp 18  Ht 5\' 7"  (1.702 m)  Wt 68.096 kg (150 lb 2 oz)  BMI 23.51 kg/m2  SpO2 93%  Intake/Output     02/24 0701 - 02/25 0700 02/25 0701 - 02/26 0700    P.O. 760 480    I.V. (mL/kg)      IV Piggyback      Total Intake(mL/kg) 760 (11.2) 480 (7)    Blood      Total Output        Net +760 +480          Urine Occurrence 4 x 250 x      Labs Results for Lindsey Weber, Lindsey Weber (MRN [id number]) as of 09/17/2013 09:37   Ref. Range  09/17/2013 04:30   Sodium  Latest Range: 137-147 mEq/L  144   Potassium  Latest Range: 3.7-5.3 mEq/L  5.3   Chloride  Latest Range: 96-112 mEq/L  105   CO2  Latest Range: 19-32 mEq/L  26   BUN  Latest Range: 6-23 mg/dL  26 (H)   Creatinine  Latest Range: 0.50-1.10 mg/dL  1.59 (H)   Calcium  Latest Range: 8.4-10.5 mg/dL  9.3   GFR calc non Af Amer  Latest Range: >90 mL/min  31 (L)   GFR calc Af Amer  Latest Range: >90 mL/min  36 (L)   Glucose  Latest Range: 70-99 mg/dL  107 (H)   WBC  Latest Range: 4.0-10.5 K/uL  10.2   RBC  Latest Range: 3.87-5.11 MIL/uL  3.30 (L)   Hemoglobin  Latest Range: 12.0-15.0 g/dL  9.8  (L)   HCT  Latest Range: 36.0-46.0 %  29.6 (L)   MCV  Latest Range: 78.0-100.0 fL  89.7   MCH  Latest Range: 26.0-34.0 pg  29.7   MCHC  Latest Range: 30.0-36.0 g/dL  33.1   RDW  Latest Range: 11.5-15.5 %  13.6   Platelets  Latest Range: 150-400 K/uL  462 (H)     Exam  Gen: Awake and alert, resting comfortably in bed, no acute distress Lungs: Clear anterior fields Cardiac: S1 and S2, systolic murmur regular Abd: Soft, nontender, positive bowel sounds Ext:      Right lower extremity             Incisions look fantastic no signs of infection  Swelling is controlled             Deep peroneal nerve, superficial peroneal nerve, tibial nerve sensory functions intact             EHL, FHL, tibialis, posterior tibialis, peroneals and gastrocsoleus complex motor function are intact             Extremity is warm             + Dorsalis pedis pulse             Compartments are soft and nontender, no pain with passive stretching             Hinge knee brace has been applied   Assessment and Plan   POD/HD#: 2    73 y/o female s/p ORIF R tibial plateau and shaft fracture  1. Complex R tibial plateau and shaft fractures:             NWB x 8 weeks             Unrestricted ROM R knee             Ice and elevated             Dressing change tomorrow             PT/OT                         HEP             hinge knee brace- unlocked               Total knee precautions                2. Pain management:             Continue with current regmien  3. ABL anemia/Hemodynamics             Stable               Cbc pending  4. DVT/PE prophylaxis:             lovenox x 2 weeks  5. ID:               Completed periop abx prophylaxis   6. Activity:             As tolerated while maintaining WB restrictions  7. FEN/Foley/Lines:              diet as tolerated             Dc lines                   Calcium and vitamin d supplementation   8. Medical issues              Restart home HTN meds today           9. Dispo:             dc home today             Follow up with ortho in 10-14 days      Jari Pigg, PA-C [facility] [phone] (P) 09/17/2013 9:36 AM   Disposition: 06-Home-Health Care Svc  Discharge Orders   Future Orders Complete By Expires   Call MD / Call 911  As directed    Comments:     If you experience chest pain or shortness  of breath, CALL 911 and be transported to the hospital emergency room.  If you develope a fever above 101 F, pus (white drainage) or increased drainage or redness at the wound, or calf pain, call your surgeon's office.   Constipation Prevention  As directed    Comments:     Drink plenty of fluids.  Prune juice may be helpful.  You may use a stool softener, such as Colace (over the counter) 100 mg twice a day.  Use MiraLax (over the counter) for constipation as needed.   Diet Carb Modified  As directed    Discharge instructions  As directed    Comments:     Orthopaedic Trauma Service Discharge Instructions   General Discharge Instructions  WEIGHT BEARING STATUS: Nonweightbearing right leg  RANGE OF MOTION/ACTIVITY: Unrestricted range of motion right knee, activity as tolerated while maintaining weight bearing restrictions  Wound care: Daily dressing changes starting in 2 days. Please see wound care instructions below Diet: as you were eating previously.  Can use over the counter stool softeners and bowel preparations, such as Miralax, to help with bowel movements.  Narcotics can be constipating.  Be sure to drink plenty of fluids  STOP SMOKING OR USING NICOTINE PRODUCTS!!!!  As discussed nicotine severely impairs your body's ability to heal surgical and traumatic wounds but also impairs bone healing.  Wounds and bone heal by forming microscopic blood vessels (angiogenesis) and nicotine is a vasoconstrictor (essentially, shrinks blood vessels).  Therefore, if vasoconstriction occurs to these  microscopic blood vessels they essentially disappear and are unable to deliver necessary nutrients to the healing tissue.  This is one modifiable factor that you can do to dramatically increase your chances of healing your injury.    (This means no smoking, no nicotine gum, patches, etc)  DO NOT USE NONSTEROIDAL ANTI-INFLAMMATORY DRUGS (NSAID'S)  Using products such as Advil (ibuprofen), Aleve (naproxen), Motrin (ibuprofen) for additional pain control during fracture healing can delay and/or prevent the healing response.  If you would like to take over the counter (OTC) medication, Tylenol (acetaminophen) is ok.  However, some narcotic medications that are given for pain control contain acetaminophen as well. Therefore, you should not exceed more than 4000 mg of tylenol in a day if you do not have liver disease.  Also note that there are may OTC medicines, such as cold medicines and allergy medicines that my contain tylenol as well.  If you have any questions about medications and/or interactions please ask your doctor/PA or your pharmacist.   PAIN MEDICATION USE AND EXPECTATIONS  You have likely been given narcotic medications to help control your pain.  After a traumatic event that results in an fracture (broken bone) with or without surgery, it is ok to use narcotic pain medications to help control one's pain.  We understand that everyone responds to pain differently and each individual patient will be evaluated on a regular basis for the continued need for narcotic medications. Ideally, narcotic medication use should last no more than 6-8 weeks (coinciding with fracture healing).   As a patient it is your responsibility as well to monitor narcotic medication use and report the amount and frequency you use these medications when you come to your office visit.   We would also advise that if you are using narcotic medications, you should take a dose prior to therapy to maximize you participation.  IF YOU  ARE ON NARCOTIC MEDICATIONS IT IS NOT PERMISSIBLE TO OPERATE A MOTOR VEHICLE (MOTORCYCLE/CAR/TRUCK/MOPED) OR  HEAVY MACHINERY DO NOT MIX NARCOTICS WITH OTHER CNS (CENTRAL NERVOUS SYSTEM) DEPRESSANTS SUCH AS ALCOHOL       ICE AND ELEVATE INJURED/OPERATIVE EXTREMITY  Using ice and elevating the injured extremity above your heart can help with swelling and pain control.  Icing in a pulsatile fashion, such as 20 minutes on and 20 minutes off, can be followed.    Do not place ice directly on skin. Make sure there is a barrier between to skin and the ice pack.    Using frozen items such as frozen peas works well as the conform nicely to the are that needs to be iced.  USE AN ACE WRAP OR TED HOSE FOR SWELLING CONTROL  In addition to icing and elevation, Ace wraps or TED hose are used to help limit and resolve swelling.  It is recommended to use Ace wraps or TED hose until you are informed to stop.    When using Ace Wraps start the wrapping distally (farthest away from the body) and wrap proximally (closer to the body)   Example: If you had surgery on your leg or thing and you do not have a splint on, start the ace wrap at the toes and work your way up to the thigh        If you had surgery on your upper extremity and do not have a splint on, start the ace wrap at your fingers and work your way up to the upper arm  IF YOU ARE IN A SPLINT OR CAST DO NOT [place]   If your splint gets wet for any reason please contact the office immediately. You may shower in your splint or cast as long as you keep it dry.  This can be done by wrapping in a cast cover or garbage back (or similar)  Do Not stick any thing down your splint or cast such as pencils, money, or hangers to try and scratch yourself with.  If you feel itchy take benadryl as prescribed on the bottle for itching  IF YOU ARE IN A CAM BOOT (BLACK BOOT)  You may remove boot periodically. Perform daily dressing changes as noted below.   Wash the liner of the boot regularly and wear a sock when wearing the boot. It is recommended that you sleep in the boot until told otherwise  CALL THE OFFICE WITH ANY QUESTIONS OR CONCERTS: [phone]     Discharge Pin Site Instructions  Dress pins daily with Kerlix roll starting on POD 2. Wrap the Kerlix so that it tamps the skin down around the pin-skin interface to prevent/limit motion of the skin relative to the pin.  (Pin-skin motion is the primary cause of pain and infection related to external fixator pin sites).  Remove any crust or coagulum that may obstruct drainage with a saline moistened gauze or soap and water.  After POD 3, if there is no discernable drainage on the pin site dressing, the interval for change can by increased to every other day.  You may shower with the fixator, cleaning all pin sites gently with soap and water.  If you have a surgical wound this needs to be completely dry and without drainage before showering.  The extremity can be lifted by the fixator to facilitate wound care and transfers.  Notify the office/Doctor if you experience increasing drainage, redness, or pain from a pin site, or if you notice purulent (thick, snot-like) drainage.  Discharge Wound Care Instructions  Do  NOT apply any ointments, solutions or lotions to pin sites or surgical wounds.  These prevent needed drainage and even though solutions like hydrogen peroxide kill bacteria, they also damage cells lining the pin sites that help fight infection.  Applying lotions or ointments can keep the wounds moist and can cause them to breakdown and open up as well. This can increase the risk for infection. When in doubt call the office.  Surgical incisions should be dressed daily.  If any drainage is noted, use one layer of adaptic, then gauze, Kerlix, and an ace wrap.  Once the incision is completely dry and without drainage, it may be left open to air out.  Showering may begin 36-48 hours  later.  Cleaning gently with soap and water.  Traumatic wounds should be dressed daily as well.    One layer of adaptic, gauze, Kerlix, then ace wrap.  The adaptic can be discontinued once the draining has ceased    If you have a wet to dry dressing: wet the gauze with saline the squeeze as much saline out so the gauze is moist (not soaking wet), place moistened gauze over wound, then place a dry gauze over the moist one, followed by Kerlix wrap, then ace wrap.   Do not put a pillow under the knee. Place it under the heel.  As directed    Driving restrictions  As directed    Comments:     No driving   Increase activity slowly as tolerated  As directed    Non weight bearing  As directed    Questions:     Laterality:     Extremity:         Medication List         albuterol (2.5 MG/3ML) 0.083% nebulizer solution  Commonly known as:  PROVENTIL  Inhale 3 mLs (2.5 mg total) into the lungs every 4 (four) hours as needed for wheezing or shortness of breath.     ALPRAZolam 0.5 MG tablet  Commonly known as:  XANAX  Take 0.5 mg by mouth as needed for anxiety.     amLODipine 5 MG tablet  Commonly known as:  NORVASC  Take 1 tablet (5 mg total) by mouth daily.     aspirin 81 MG chewable tablet  Chew 1 tablet (81 mg total) by mouth daily.     calcium carbonate 600 MG Tabs tablet  Commonly known as:  OS-CAL  Take 600 mg by mouth 2 (two) times daily with a meal.     Cholecalciferol 1000 UNITS capsule  Take 1 capsule (1,000 Units total) by mouth daily.     Co Q 10 100 MG Caps  Take 1 capsule by mouth daily.     cyanocobalamin 1000 MCG tablet  Take 100 mcg by mouth daily.     cyanocobalamin 500 MCG tablet  Take 500 mcg by mouth daily.     DSS 100 MG Caps  Take 100 mg by mouth 2 (two) times daily.     enoxaparin 30 MG/0.3ML injection  Commonly known as:  LOVENOX  Inject 0.3 mLs (30 mg total) into the skin daily.     glipiZIDE 5 MG tablet  Commonly known as:  GLUCOTROL  Take  0.5 tablets (2.5 mg total) by mouth daily before breakfast.     HYDROcodone-acetaminophen 5-325 MG per tablet  Commonly known as:  NORCO/VICODIN  Take 1-2 tablets by mouth every 6 (six) hours as needed for moderate pain or severe pain.  methocarbamol 500 MG tablet  Commonly known as:  ROBAXIN  Take 1-2 tablets (500-1,000 mg total) by mouth every 6 (six) hours as needed for muscle spasms.     multivitamin with minerals tablet  Take 1 tablet by mouth daily.     omega-3 acid ethyl esters 1 G capsule  Commonly known as:  LOVAZA  Take 1 g by mouth daily.     oxyCODONE 5 MG immediate release tablet  Commonly known as:  ROXICODONE  Take 1 tablet (5 mg total) by mouth every 4 (four) hours as needed for breakthrough pain (take between hydrocodone doses for breakthrough pain only).     simvastatin 5 MG tablet  Commonly known as:  ZOCOR  Take 5 mg by mouth daily.           Follow-up Information   Follow up with HANDY,MICHAEL H, MD. Schedule an appointment as soon as possible for a visit in 10 days.   Specialty:  Orthopedic Surgery   Contact information:   [address] [place] [address] [phone]       Follow up with [name], MD In 1 week.   Specialty:  Internal Medicine   Contact information:   [place], [place] [address] [phone]       Discharge Instructions and Plan:  Lindsey Weber has sustained a fairly severe injury to the right knee. We were able to achieve excellent fixation with plate osteosynthesis. It was a technically successful procedure. We were able to restore alignment, assess for meniscal injury, address stability and restored joint surface congruity which are all favorable to a successful outcome. Patient is still at risk for the development of posttraumatic arthritis which has been discussed at length and we will continue to monitor the patient for signs and symptoms of such. Patient will be  nonweightbearing for the next 6-8 weeks. Unrestricted range of motion of the right knee in the hinged knee brace. total knee precautions should be followed when at rest We will refer her to outpatient physical therapy after the first postoperative visit. she will be on lovenox for DVT/PE prophylaxis for 2 weeks/days Daily dressing changes should be performed as per discharge wound care instructions. Patient can resume prehospital diet Patient will be on tylenol, oxycodone, Robaxin for pain control We will see the patient back in the office in 10-14 days for reevaluation, followup x-rays and removal of sutures. Should the patient have any questions for the first postoperative visit and encouraged to contact the office immediately. Patient will be vigilant for any redness increased drainage and increased pain, which are suggestive of infection and will contact the office immediately. Patient will also be vigilant for fevers or chills or any other concerning signs. They will contact the office if any of these develop.   Signed:  Jari Pigg, PA-C [facility] [phone] (P) 09/17/2013, 9:46 AM

## 2013-09-17 NOTE — Progress Notes (Signed)
Orthopaedic Trauma Service Progress Note  Subjective  Doing well Pain controlled Ready to go home No issues Voiding w/o difficulty + Flatus  BP elevated- restart home meds today   Review of Systems  Constitutional: Negative for fever and chills.  Respiratory: Negative for shortness of breath and wheezing.   Cardiovascular: Negative for chest pain and palpitations.  Gastrointestinal: Negative for nausea, vomiting and abdominal pain.  Genitourinary: Negative for dysuria.  Neurological: Negative for tingling and sensory change.    Objective   BP 184/58  Pulse 88  Temp(Src) 97.9 F (36.6 C) (Oral)  Resp 18  Ht 5\' 7"  (1.702 m)  Wt 68.096 kg (150 lb 2 oz)  BMI 23.51 kg/m2  SpO2 93%  Intake/Output     02/24 0701 - 02/25 0700 02/25 0701 - 02/26 0700   P.O. 760 480   I.V. (mL/kg)     IV Piggyback     Total Intake(mL/kg) 760 (11.2) 480 (7)   Blood     Total Output       Net +760 +480        Urine Occurrence 4 x 250 x     Labs Results for BEVERLEY, ALLENDER (MRN [id number]) as of 09/17/2013 09:37  Ref. Range 09/17/2013 04:30  Sodium Latest Range: 137-147 mEq/L 144  Potassium Latest Range: 3.7-5.3 mEq/L 5.3  Chloride Latest Range: 96-112 mEq/L 105  CO2 Latest Range: 19-32 mEq/L 26  BUN Latest Range: 6-23 mg/dL 26 (H)  Creatinine Latest Range: 0.50-1.10 mg/dL 1.59 (H)  Calcium Latest Range: 8.4-10.5 mg/dL 9.3  GFR calc non Af Amer Latest Range: >90 mL/min 31 (L)  GFR calc Af Amer Latest Range: >90 mL/min 36 (L)  Glucose Latest Range: 70-99 mg/dL 107 (H)  WBC Latest Range: 4.0-10.5 K/uL 10.2  RBC Latest Range: 3.87-5.11 MIL/uL 3.30 (L)  Hemoglobin Latest Range: 12.0-15.0 g/dL 9.8 (L)  HCT Latest Range: 36.0-46.0 % 29.6 (L)  MCV Latest Range: 78.0-100.0 fL 89.7  MCH Latest Range: 26.0-34.0 pg 29.7  MCHC Latest Range: 30.0-36.0 g/dL 33.1  RDW Latest Range: 11.5-15.5 % 13.6  Platelets Latest Range: 150-400 K/uL 462 (H)    Exam  Gen: Awake and alert, resting  comfortably in bed, no acute distress Lungs: Clear anterior fields Cardiac: S1 and S2, systolic murmur regular Abd: Soft, nontender, positive bowel sounds Ext:     Right lower extremity  Incisions look fantastic no signs of infection  Swelling is controlled  Deep peroneal nerve, superficial peroneal nerve, tibial nerve sensory functions intact  EHL, FHL, tibialis, posterior tibialis, peroneals and gastrocsoleus complex motor function are intact  Extremity is warm  + Dorsalis pedis pulse  Compartments are soft and nontender, no pain with passive stretching  Hinge knee brace has been applied   Assessment and Plan   POD/HD#: 2    73 y/o female s/p ORIF R tibial plateau and shaft fracture  1. Complex R tibial plateau and shaft fractures:             NWB x 8 weeks             Unrestricted ROM R knee             Ice and elevated             Dressing change tomorrow             PT/OT  HEP             hinge knee brace- unlocked               Total knee precautions                2. Pain management:             Continue with current regmien  3. ABL anemia/Hemodynamics             Stable               Cbc pending  4. DVT/PE prophylaxis:             lovenox x 2 weeks  5. ID:               Completed periop abx prophylaxis   6. Activity:             As tolerated while maintaining WB restrictions  7. FEN/Foley/Lines:              diet as tolerated  Dc lines        Calcium and vitamin d supplementation   8. Medical issues  Restart home HTN meds today          9. Dispo:             dc home today  Follow up with ortho in 10-14 days     Jari Pigg, PA-C [facility] [phone] (P) 09/17/2013 9:36 AM

## 2013-09-17 NOTE — Discharge Instructions (Signed)
[facility] Service Discharge Instructions   General Discharge Instructions  WEIGHT BEARING STATUS: Nonweightbearing right leg  RANGE OF MOTION/ACTIVITY: Unrestricted range of motion right knee, activity as tolerated while maintaining weight bearing restrictions  Wound care: Daily dressing changes starting in 2 days. Please see wound care instructions below Diet: as you were eating previously.  Can use over the counter stool softeners and bowel preparations, such as Miralax, to help with bowel movements.  Narcotics can be constipating.  Be sure to drink plenty of fluids  STOP SMOKING OR USING NICOTINE PRODUCTS!!!!  As discussed nicotine severely impairs your body's ability to heal surgical and traumatic wounds but also impairs bone healing.  Wounds and bone heal by forming microscopic blood vessels (angiogenesis) and nicotine is a vasoconstrictor (essentially, shrinks blood vessels).  Therefore, if vasoconstriction occurs to these microscopic blood vessels they essentially disappear and are unable to deliver necessary nutrients to the healing tissue.  This is one modifiable factor that you can do to dramatically increase your chances of healing your injury.    (This means no smoking, no nicotine gum, patches, etc)  DO NOT USE NONSTEROIDAL ANTI-INFLAMMATORY DRUGS (NSAID'S)  Using products such as Advil (ibuprofen), Aleve (naproxen), Motrin (ibuprofen) for additional pain control during fracture healing can delay and/or prevent the healing response.  If you would like to take over the counter (OTC) medication, Tylenol (acetaminophen) is ok.  However, some narcotic medications that are given for pain control contain acetaminophen as well. Therefore, you should not exceed more than 4000 mg of tylenol in a day if you do not have liver disease.  Also note that there are may OTC medicines, such as cold medicines and allergy medicines that my contain tylenol as well.  If you have any questions about  medications and/or interactions please ask your doctor/PA or your pharmacist.   PAIN MEDICATION USE AND EXPECTATIONS  You have likely been given narcotic medications to help control your pain.  After a traumatic event that results in an fracture (broken bone) with or without surgery, it is ok to use narcotic pain medications to help control one's pain.  We understand that everyone responds to pain differently and each individual patient will be evaluated on a regular basis for the continued need for narcotic medications. Ideally, narcotic medication use should last no more than 6-8 weeks (coinciding with fracture healing).   As a patient it is your responsibility as well to monitor narcotic medication use and report the amount and frequency you use these medications when you come to your office visit.   We would also advise that if you are using narcotic medications, you should take a dose prior to therapy to maximize you participation.  IF YOU ARE ON NARCOTIC MEDICATIONS IT IS NOT PERMISSIBLE TO OPERATE A MOTOR VEHICLE (MOTORCYCLE/CAR/TRUCK/MOPED) OR HEAVY MACHINERY DO NOT MIX NARCOTICS WITH OTHER CNS (CENTRAL NERVOUS SYSTEM) DEPRESSANTS SUCH AS ALCOHOL       ICE AND ELEVATE INJURED/OPERATIVE EXTREMITY  Using ice and elevating the injured extremity above your heart can help with swelling and pain control.  Icing in a pulsatile fashion, such as 20 minutes on and 20 minutes off, can be followed.    Do not place ice directly on skin. Make sure there is a barrier between to skin and the ice pack.    Using frozen items such as frozen peas works well as the conform nicely to the are that needs to be iced.  USE AN ACE WRAP OR TED  HOSE FOR SWELLING CONTROL  In addition to icing and elevation, Ace wraps or TED hose are used to help limit and resolve swelling.  It is recommended to use Ace wraps or TED hose until you are informed to stop.    When using Ace Wraps start the wrapping distally (farthest away  from the body) and wrap proximally (closer to the body)   Example: If you had surgery on your leg or thing and you do not have a splint on, start the ace wrap at the toes and work your way up to the thigh        If you had surgery on your upper extremity and do not have a splint on, start the ace wrap at your fingers and work your way up to the upper arm  IF YOU ARE IN A SPLINT OR CAST DO NOT [place]   If your splint gets wet for any reason please contact the office immediately. You may shower in your splint or cast as long as you keep it dry.  This can be done by wrapping in a cast cover or garbage back (or similar)  Do Not stick any thing down your splint or cast such as pencils, money, or hangers to try and scratch yourself with.  If you feel itchy take benadryl as prescribed on the bottle for itching  IF YOU ARE IN A CAM BOOT (BLACK BOOT)  You may remove boot periodically. Perform daily dressing changes as noted below.  Wash the liner of the boot regularly and wear a sock when wearing the boot. It is recommended that you sleep in the boot until told otherwise  CALL THE OFFICE WITH ANY QUESTIONS OR CONCERTS: [phone]     Discharge Pin Site Instructions  Dress pins daily with Kerlix roll starting on POD 2. Wrap the Kerlix so that it tamps the skin down around the pin-skin interface to prevent/limit motion of the skin relative to the pin.  (Pin-skin motion is the primary cause of pain and infection related to external fixator pin sites).  Remove any crust or coagulum that may obstruct drainage with a saline moistened gauze or soap and water.  After POD 3, if there is no discernable drainage on the pin site dressing, the interval for change can by increased to every other day.  You may shower with the fixator, cleaning all pin sites gently with soap and water.  If you have a surgical wound this needs to be completely dry and without drainage before showering.  The  extremity can be lifted by the fixator to facilitate wound care and transfers.  Notify the office/Doctor if you experience increasing drainage, redness, or pain from a pin site, or if you notice purulent (thick, snot-like) drainage.  Discharge Wound Care Instructions  Do NOT apply any ointments, solutions or lotions to pin sites or surgical wounds.  These prevent needed drainage and even though solutions like hydrogen peroxide kill bacteria, they also damage cells lining the pin sites that help fight infection.  Applying lotions or ointments can keep the wounds moist and can cause them to breakdown and open up as well. This can increase the risk for infection. When in doubt call the office.  Surgical incisions should be dressed daily.  If any drainage is noted, use one layer of adaptic, then gauze, Kerlix, and an ace wrap.  Once the incision is completely dry and without drainage, it may be left open to air out.  Showering may begin 36-48 hours later.  Cleaning gently with soap and water.  Traumatic wounds should be dressed daily as well.    One layer of adaptic, gauze, Kerlix, then ace wrap.  The adaptic can be discontinued once the draining has ceased    If you have a wet to dry dressing: wet the gauze with saline the squeeze as much saline out so the gauze is moist (not soaking wet), place moistened gauze over wound, then place a dry gauze over the moist one, followed by Kerlix wrap, then ace wrap.

## 2013-09-17 NOTE — Progress Notes (Signed)
Patient d/c to home.  IV removed, precriptions given and instructions reviewed.  Lovenox teaching reviewed.

## 2013-09-17 NOTE — Addendum Note (Signed)
Addendum created 09/17/13 2027 by Kate Sable, MD   Modules edited: Anesthesia Responsible Staff

## 2014-01-15 ENCOUNTER — Ambulatory Visit: Payer: Medicare PPO | Attending: Orthopedic Surgery | Admitting: Physical Therapy

## 2014-01-15 DIAGNOSIS — IMO0001 Reserved for inherently not codable concepts without codable children: Secondary | ICD-10-CM | POA: Insufficient documentation

## 2014-01-15 DIAGNOSIS — M546 Pain in thoracic spine: Secondary | ICD-10-CM | POA: Insufficient documentation

## 2014-01-27 ENCOUNTER — Ambulatory Visit: Payer: Medicare PPO | Attending: Orthopedic Surgery | Admitting: Physical Therapy

## 2014-01-27 DIAGNOSIS — IMO0001 Reserved for inherently not codable concepts without codable children: Secondary | ICD-10-CM | POA: Diagnosis not present

## 2014-01-27 DIAGNOSIS — M546 Pain in thoracic spine: Secondary | ICD-10-CM | POA: Diagnosis not present

## 2014-01-29 ENCOUNTER — Ambulatory Visit: Payer: Medicare PPO | Admitting: Physical Therapy

## 2014-01-29 DIAGNOSIS — IMO0001 Reserved for inherently not codable concepts without codable children: Secondary | ICD-10-CM | POA: Diagnosis not present

## 2014-02-03 ENCOUNTER — Ambulatory Visit: Payer: Medicare PPO | Admitting: Physical Therapy

## 2014-02-03 DIAGNOSIS — IMO0001 Reserved for inherently not codable concepts without codable children: Secondary | ICD-10-CM | POA: Diagnosis not present

## 2014-02-06 ENCOUNTER — Ambulatory Visit: Payer: Medicare PPO | Admitting: Physical Therapy

## 2014-02-06 DIAGNOSIS — IMO0001 Reserved for inherently not codable concepts without codable children: Secondary | ICD-10-CM | POA: Diagnosis not present

## 2014-02-10 ENCOUNTER — Ambulatory Visit: Payer: Medicare PPO | Admitting: Physical Therapy

## 2014-02-10 DIAGNOSIS — IMO0001 Reserved for inherently not codable concepts without codable children: Secondary | ICD-10-CM | POA: Diagnosis not present

## 2014-02-12 ENCOUNTER — Ambulatory Visit: Payer: Medicare PPO | Admitting: Physical Therapy

## 2014-02-12 DIAGNOSIS — IMO0001 Reserved for inherently not codable concepts without codable children: Secondary | ICD-10-CM | POA: Diagnosis not present

## 2014-10-28 ENCOUNTER — Encounter (HOSPITAL_BASED_OUTPATIENT_CLINIC_OR_DEPARTMENT_OTHER): Payer: Self-pay | Admitting: *Deleted

## 2014-10-30 NOTE — H&P (Signed)
  Subjective:    Patient ID: Lindsey Weber is a 74 y.o. female.  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 Present since 08/2013, has been treating with peroxide and neosporin as at some time someone counseled may be splinter or FB production lesion. No prior biopsy. Has had history skin cancer BCC nasal al treated by Dr. Jeanella Flattery with local flap. Does not get regular skin checks.   We reviewed her chart history- patient states that if she has aortic stenosis or carotid stenosis, "no one ever told me."  HbA1c per pt 6  Review of Systems     Objective:    Physical Exam  HENT:  Right cheek scar ("blackhead" per pt), and left nasal ala rotation flap, well healed soft  Cardiovascular: Normal rate.  Pulmonary/Chest: Effort normal.  Musculoskeletal:  Left dorsal at in 2nd webspace 1.5 cm crusted hypertrophic lesion       Assessment:      Left hand SCC    Plan:      Plan excision with sedation. Reviewed risks recurrence, wound healing problems, damage to deeper structures, need for additional procedures, numbness.  Irene Limbo, MD [facility] Plastic & Reconstructive Surgery [phone]

## 2014-11-03 ENCOUNTER — Encounter (HOSPITAL_BASED_OUTPATIENT_CLINIC_OR_DEPARTMENT_OTHER): Admission: RE | Payer: Self-pay | Source: Ambulatory Visit

## 2014-11-03 ENCOUNTER — Ambulatory Visit (HOSPITAL_BASED_OUTPATIENT_CLINIC_OR_DEPARTMENT_OTHER): Admission: RE | Admit: 2014-11-03 | Payer: Medicare PPO | Source: Ambulatory Visit | Admitting: Plastic Surgery

## 2014-11-03 HISTORY — DX: Squamous cell carcinoma of skin of unspecified upper limb, including shoulder: C44.621

## 2014-11-03 HISTORY — DX: Cardiac murmur, unspecified: R01.1

## 2014-11-03 SURGERY — EXCISION, LESION, HAND
Anesthesia: Monitor Anesthesia Care | Laterality: Left

## 2014-11-08 IMAGING — DX DG KNEE 1-2V PORT*R*
3 series · 3 of 3 positions shown · non-contrast
Comparison: Intraoperative radiographs 09/15/2013.

CLINICAL DATA: Status post ORIF.

EXAM:
PORTABLE RIGHT KNEE - 1-2 VIEW

[ap]
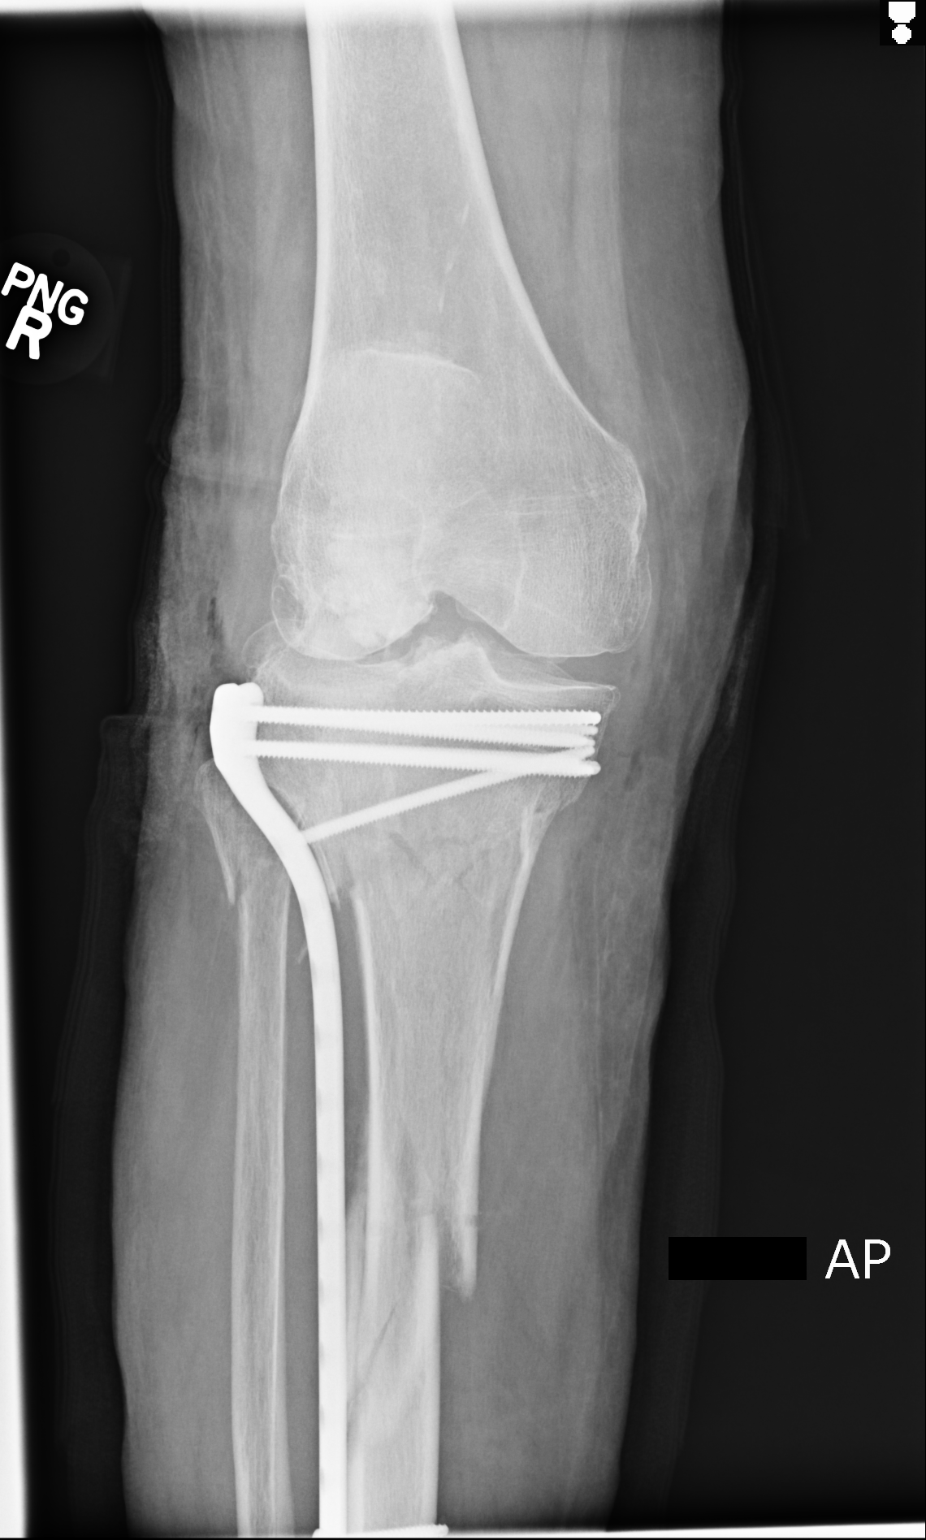

[lat]
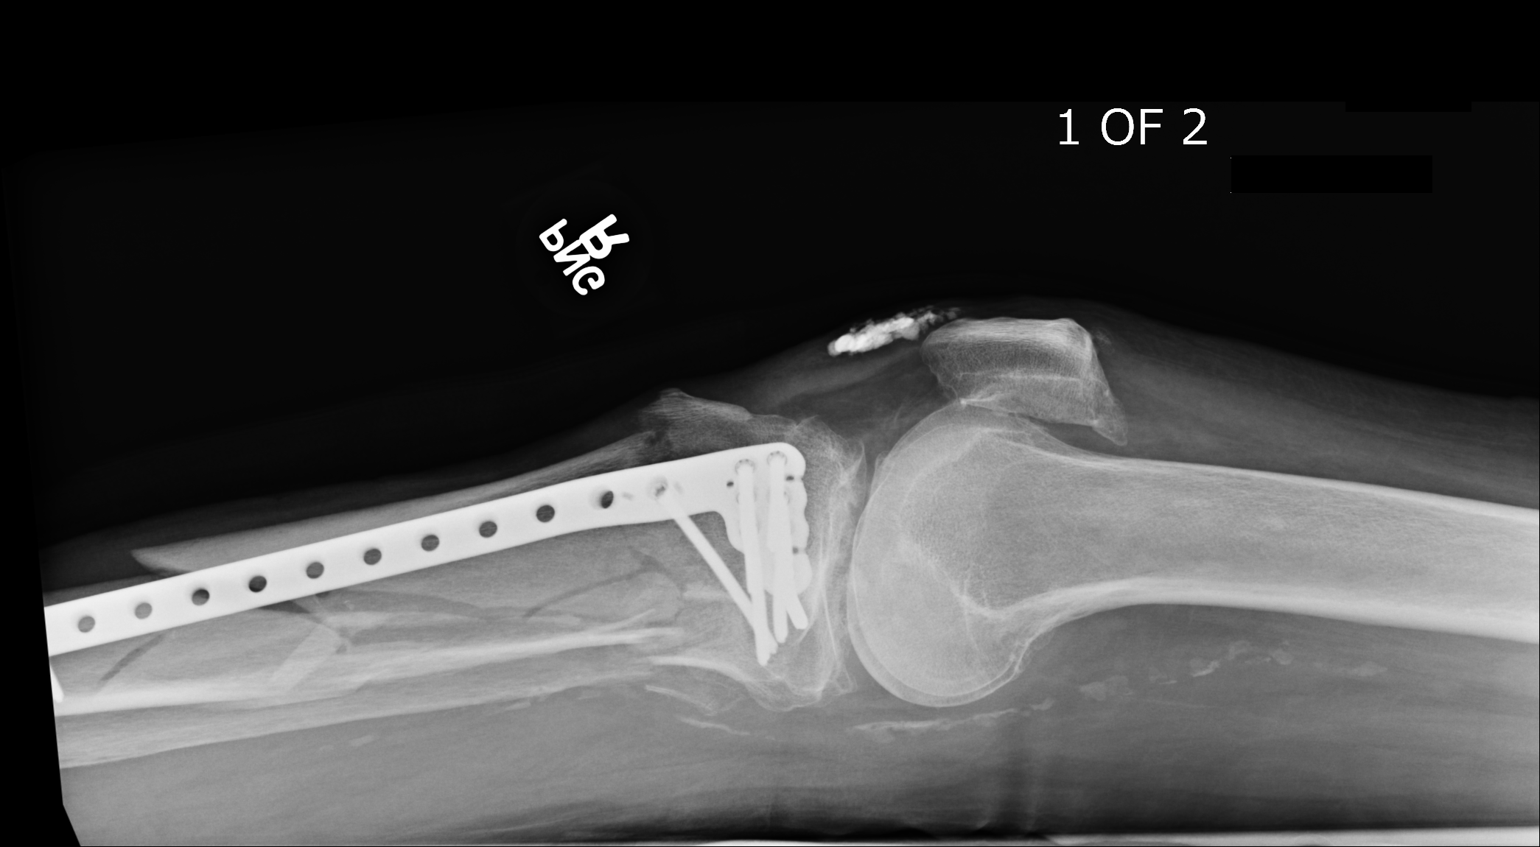

[oblique]
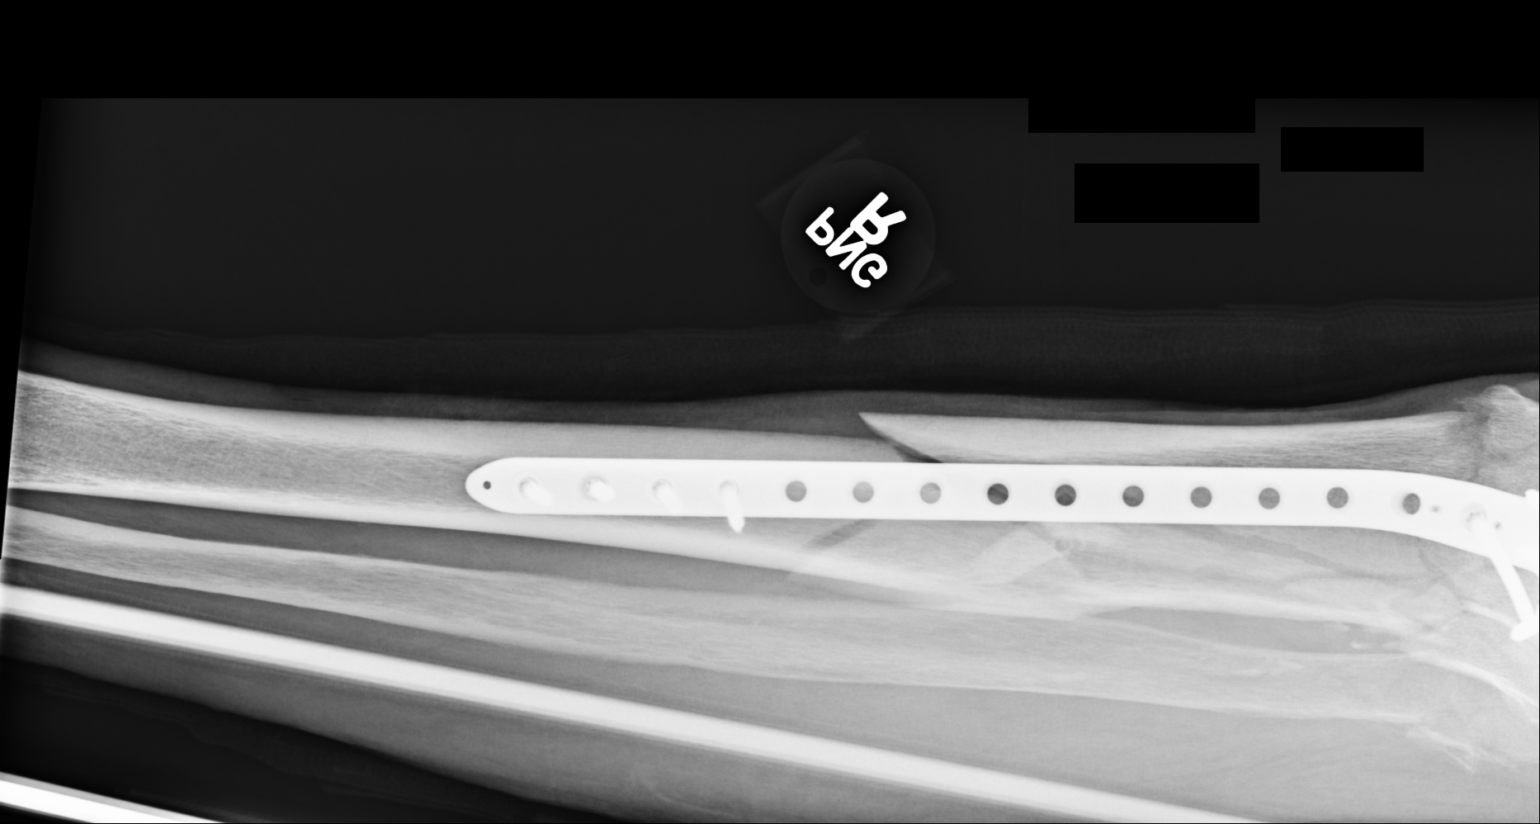

[3 of 3 positions shown; findings below may reference images not displayed]

FINDINGS: The patient is status post lateral plate and screw fixation of the
comminuted tibial fracture. Lateral displacement of [DATE] the shaft
width is noted in the proximal tibia. There is also persistent
posterior displacement at the same level. The hardware is intact.
There is no radiographic evidence for complication. Calcification in
the patellar tendon is stable.
IMPRESSION: 1. Status post ORIF of a comminuted tibial fracture with reduction
as described.
2. No radiographic evidence for complication.

## 2015-08-11 ENCOUNTER — Inpatient Hospital Stay (HOSPITAL_COMMUNITY)
Admission: EM | Admit: 2015-08-11 | Discharge: 2015-08-25 | DRG: 871 | Disposition: E | Payer: Medicare Other | Attending: Internal Medicine | Admitting: Internal Medicine

## 2015-08-11 ENCOUNTER — Emergency Department (HOSPITAL_COMMUNITY): Payer: Medicare Other

## 2015-08-11 ENCOUNTER — Encounter (HOSPITAL_COMMUNITY): Payer: Self-pay | Admitting: Emergency Medicine

## 2015-08-11 DIAGNOSIS — J440 Chronic obstructive pulmonary disease with acute lower respiratory infection: Secondary | ICD-10-CM | POA: Diagnosis present

## 2015-08-11 DIAGNOSIS — K7689 Other specified diseases of liver: Secondary | ICD-10-CM

## 2015-08-11 DIAGNOSIS — Z66 Do not resuscitate: Secondary | ICD-10-CM | POA: Diagnosis present

## 2015-08-11 DIAGNOSIS — I739 Peripheral vascular disease, unspecified: Secondary | ICD-10-CM | POA: Diagnosis present

## 2015-08-11 DIAGNOSIS — K219 Gastro-esophageal reflux disease without esophagitis: Secondary | ICD-10-CM | POA: Diagnosis present

## 2015-08-11 DIAGNOSIS — D509 Iron deficiency anemia, unspecified: Secondary | ICD-10-CM | POA: Diagnosis present

## 2015-08-11 DIAGNOSIS — I214 Non-ST elevation (NSTEMI) myocardial infarction: Secondary | ICD-10-CM | POA: Diagnosis not present

## 2015-08-11 DIAGNOSIS — N179 Acute kidney failure, unspecified: Secondary | ICD-10-CM | POA: Diagnosis present

## 2015-08-11 DIAGNOSIS — Z8249 Family history of ischemic heart disease and other diseases of the circulatory system: Secondary | ICD-10-CM | POA: Diagnosis not present

## 2015-08-11 DIAGNOSIS — I350 Nonrheumatic aortic (valve) stenosis: Secondary | ICD-10-CM

## 2015-08-11 DIAGNOSIS — A419 Sepsis, unspecified organism: Secondary | ICD-10-CM | POA: Diagnosis present

## 2015-08-11 DIAGNOSIS — R222 Localized swelling, mass and lump, trunk: Secondary | ICD-10-CM | POA: Diagnosis not present

## 2015-08-11 DIAGNOSIS — E860 Dehydration: Secondary | ICD-10-CM | POA: Diagnosis present

## 2015-08-11 DIAGNOSIS — J9819 Other pulmonary collapse: Secondary | ICD-10-CM | POA: Diagnosis present

## 2015-08-11 DIAGNOSIS — R0602 Shortness of breath: Secondary | ICD-10-CM

## 2015-08-11 DIAGNOSIS — I469 Cardiac arrest, cause unspecified: Secondary | ICD-10-CM | POA: Diagnosis not present

## 2015-08-11 DIAGNOSIS — E1151 Type 2 diabetes mellitus with diabetic peripheral angiopathy without gangrene: Secondary | ICD-10-CM | POA: Diagnosis present

## 2015-08-11 DIAGNOSIS — Z90710 Acquired absence of both cervix and uterus: Secondary | ICD-10-CM | POA: Diagnosis not present

## 2015-08-11 DIAGNOSIS — I130 Hypertensive heart and chronic kidney disease with heart failure and stage 1 through stage 4 chronic kidney disease, or unspecified chronic kidney disease: Secondary | ICD-10-CM | POA: Diagnosis present

## 2015-08-11 DIAGNOSIS — E872 Acidosis: Secondary | ICD-10-CM | POA: Diagnosis present

## 2015-08-11 DIAGNOSIS — J189 Pneumonia, unspecified organism: Secondary | ICD-10-CM | POA: Diagnosis present

## 2015-08-11 DIAGNOSIS — Z9889 Other specified postprocedural states: Secondary | ICD-10-CM

## 2015-08-11 DIAGNOSIS — J9621 Acute and chronic respiratory failure with hypoxia: Secondary | ICD-10-CM | POA: Diagnosis present

## 2015-08-11 DIAGNOSIS — I509 Heart failure, unspecified: Secondary | ICD-10-CM | POA: Diagnosis not present

## 2015-08-11 DIAGNOSIS — E1122 Type 2 diabetes mellitus with diabetic chronic kidney disease: Secondary | ICD-10-CM | POA: Diagnosis present

## 2015-08-11 DIAGNOSIS — R590 Localized enlarged lymph nodes: Secondary | ICD-10-CM | POA: Diagnosis present

## 2015-08-11 DIAGNOSIS — J9601 Acute respiratory failure with hypoxia: Secondary | ICD-10-CM | POA: Diagnosis not present

## 2015-08-11 DIAGNOSIS — J441 Chronic obstructive pulmonary disease with (acute) exacerbation: Secondary | ICD-10-CM | POA: Diagnosis present

## 2015-08-11 DIAGNOSIS — C3490 Malignant neoplasm of unspecified part of unspecified bronchus or lung: Secondary | ICD-10-CM | POA: Diagnosis present

## 2015-08-11 DIAGNOSIS — Z833 Family history of diabetes mellitus: Secondary | ICD-10-CM

## 2015-08-11 DIAGNOSIS — E785 Hyperlipidemia, unspecified: Secondary | ICD-10-CM | POA: Diagnosis present

## 2015-08-11 DIAGNOSIS — Z79899 Other long term (current) drug therapy: Secondary | ICD-10-CM | POA: Diagnosis not present

## 2015-08-11 DIAGNOSIS — J181 Lobar pneumonia, unspecified organism: Secondary | ICD-10-CM | POA: Diagnosis not present

## 2015-08-11 DIAGNOSIS — I5032 Chronic diastolic (congestive) heart failure: Secondary | ICD-10-CM | POA: Diagnosis present

## 2015-08-11 DIAGNOSIS — Z720 Tobacco use: Secondary | ICD-10-CM | POA: Diagnosis not present

## 2015-08-11 DIAGNOSIS — I429 Cardiomyopathy, unspecified: Secondary | ICD-10-CM | POA: Diagnosis present

## 2015-08-11 DIAGNOSIS — N183 Chronic kidney disease, stage 3 (moderate): Secondary | ICD-10-CM | POA: Diagnosis present

## 2015-08-11 DIAGNOSIS — R918 Other nonspecific abnormal finding of lung field: Secondary | ICD-10-CM | POA: Diagnosis present

## 2015-08-11 DIAGNOSIS — I6523 Occlusion and stenosis of bilateral carotid arteries: Secondary | ICD-10-CM | POA: Diagnosis present

## 2015-08-11 DIAGNOSIS — I10 Essential (primary) hypertension: Secondary | ICD-10-CM | POA: Diagnosis present

## 2015-08-11 DIAGNOSIS — E119 Type 2 diabetes mellitus without complications: Secondary | ICD-10-CM

## 2015-08-11 DIAGNOSIS — I5033 Acute on chronic diastolic (congestive) heart failure: Secondary | ICD-10-CM | POA: Diagnosis present

## 2015-08-11 DIAGNOSIS — Z7982 Long term (current) use of aspirin: Secondary | ICD-10-CM

## 2015-08-11 DIAGNOSIS — F17210 Nicotine dependence, cigarettes, uncomplicated: Secondary | ICD-10-CM | POA: Diagnosis present

## 2015-08-11 DIAGNOSIS — F419 Anxiety disorder, unspecified: Secondary | ICD-10-CM | POA: Diagnosis present

## 2015-08-11 DIAGNOSIS — R945 Abnormal results of liver function studies: Secondary | ICD-10-CM | POA: Diagnosis present

## 2015-08-11 LAB — BLOOD GAS, ARTERIAL
Acid-base deficit: 6 mmol/L — ABNORMAL HIGH (ref 0.0–2.0)
BICARBONATE: 17.5 meq/L — AB (ref 20.0–24.0)
DRAWN BY: 232811
O2 Content: 4 L/min
O2 SAT: 96 %
Patient temperature: 97.6
TCO2: 16.8 mmol/L (ref 0–100)
pCO2 arterial: 27 mmHg — ABNORMAL LOW (ref 35.0–45.0)
pH, Arterial: 7.424 (ref 7.350–7.450)
pO2, Arterial: 79.9 mmHg — ABNORMAL LOW (ref 80.0–100.0)

## 2015-08-11 LAB — COMPREHENSIVE METABOLIC PANEL
ALBUMIN: 2.2 g/dL — AB (ref 3.5–5.0)
ALT: 92 U/L — ABNORMAL HIGH (ref 14–54)
AST: 79 U/L — AB (ref 15–41)
Alkaline Phosphatase: 386 U/L — ABNORMAL HIGH (ref 38–126)
Anion gap: 14 (ref 5–15)
BUN: 57 mg/dL — AB (ref 6–20)
CALCIUM: 9.2 mg/dL (ref 8.9–10.3)
CO2: 20 mmol/L — ABNORMAL LOW (ref 22–32)
Chloride: 104 mmol/L (ref 101–111)
Creatinine, Ser: 2.4 mg/dL — ABNORMAL HIGH (ref 0.44–1.00)
GFR calc Af Amer: 22 mL/min — ABNORMAL LOW (ref >60)
GFR calc non Af Amer: 19 mL/min — ABNORMAL LOW (ref >60)
GLUCOSE: 153 mg/dL — AB (ref 65–99)
POTASSIUM: 4.5 mmol/L (ref 3.5–5.1)
Sodium: 138 mmol/L (ref 135–145)
Total Bilirubin: 0.8 mg/dL (ref 0.3–1.2)
Total Protein: 7.4 g/dL (ref 6.5–8.1)

## 2015-08-11 LAB — CBC WITH DIFFERENTIAL/PLATELET
BASOS ABS: 0 10*3/uL (ref 0.0–0.1)
BASOS PCT: 0 %
EOS PCT: 0 %
Eosinophils Absolute: 0 10*3/uL (ref 0.0–0.7)
HCT: 25.3 % — ABNORMAL LOW (ref 36.0–46.0)
Hemoglobin: 7.9 g/dL — ABNORMAL LOW (ref 12.0–15.0)
Lymphocytes Relative: 5 %
Lymphs Abs: 1.4 10*3/uL (ref 0.7–4.0)
MCH: 24.5 pg — AB (ref 26.0–34.0)
MCHC: 31.2 g/dL (ref 30.0–36.0)
MCV: 78.6 fL (ref 78.0–100.0)
Monocytes Absolute: 0.6 10*3/uL (ref 0.1–1.0)
Monocytes Relative: 2 %
NEUTROS PCT: 93 %
Neutro Abs: 26.4 10*3/uL — ABNORMAL HIGH (ref 1.7–7.7)
Platelets: 802 10*3/uL — ABNORMAL HIGH (ref 150–400)
RBC: 3.22 MIL/uL — AB (ref 3.87–5.11)
RDW: 15.1 % (ref 11.5–15.5)
WBC: 28.4 10*3/uL — ABNORMAL HIGH (ref 4.0–10.5)

## 2015-08-11 LAB — I-STAT CG4 LACTIC ACID, ED: LACTIC ACID, VENOUS: 1.79 mmol/L (ref 0.5–2.0)

## 2015-08-11 LAB — BRAIN NATRIURETIC PEPTIDE: B Natriuretic Peptide: 1918 pg/mL — ABNORMAL HIGH (ref 0.0–100.0)

## 2015-08-11 LAB — TROPONIN I: Troponin I: 0.17 ng/mL — ABNORMAL HIGH (ref <0.031)

## 2015-08-11 MED ORDER — IPRATROPIUM-ALBUTEROL 0.5-2.5 (3) MG/3ML IN SOLN
3.0000 mL | Freq: Three times a day (TID) | RESPIRATORY_TRACT | Status: DC
  Administered 2015-08-12 (×3): 3 mL via RESPIRATORY_TRACT
  Filled 2015-08-11 (×3): qty 3

## 2015-08-11 MED ORDER — SODIUM CHLORIDE 0.9 % IV SOLN: INTRAVENOUS | Status: DC

## 2015-08-11 MED ORDER — DEXTROSE 5 % IV SOLN
1.0000 g | Freq: Once | INTRAVENOUS | Status: AC
  Administered 2015-08-11: 1 g via INTRAVENOUS
  Filled 2015-08-11: qty 10

## 2015-08-11 MED ORDER — MORPHINE SULFATE (PF) 2 MG/ML IV SOLN
2.0000 mg | INTRAVENOUS | Status: DC | PRN
  Administered 2015-08-12: 2 mg via INTRAVENOUS
  Filled 2015-08-11: qty 1

## 2015-08-11 MED ORDER — DEXTROSE 5 % IV SOLN: 500.0000 mg | INTRAVENOUS | Status: DC

## 2015-08-11 MED ORDER — METHYLPREDNISOLONE SODIUM SUCC 125 MG IJ SOLR: 125.0000 mg | Freq: Once | INTRAMUSCULAR | Status: DC

## 2015-08-11 MED ORDER — OMEGA-3-ACID ETHYL ESTERS 1 G PO CAPS
1.0000 g | ORAL_CAPSULE | Freq: Every day | ORAL | Status: DC
  Administered 2015-08-12 (×2): 1 g via ORAL
  Filled 2015-08-11 (×3): qty 1

## 2015-08-11 MED ORDER — IPRATROPIUM-ALBUTEROL 0.5-2.5 (3) MG/3ML IN SOLN
3.0000 mL | RESPIRATORY_TRACT | Status: DC
  Administered 2015-08-11 (×2): 3 mL via RESPIRATORY_TRACT
  Filled 2015-08-11 (×2): qty 3

## 2015-08-11 MED ORDER — NITROGLYCERIN 0.4 MG SL SUBL: 0.4000 mg | SUBLINGUAL_TABLET | SUBLINGUAL | Status: DC | PRN

## 2015-08-11 MED ORDER — VITAMIN C 500 MG PO TABS
1000.0000 mg | ORAL_TABLET | Freq: Every day | ORAL | Status: DC
  Administered 2015-08-12: 1000 mg via ORAL
  Filled 2015-08-11 (×2): qty 2

## 2015-08-11 MED ORDER — DEXTROSE 5 % IV SOLN: 1.0000 g | INTRAVENOUS | Status: DC

## 2015-08-11 MED ORDER — INSULIN ASPART 100 UNIT/ML ~~LOC~~ SOLN
0.0000 [IU] | Freq: Three times a day (TID) | SUBCUTANEOUS | Status: DC
  Administered 2015-08-12: 3 [IU] via SUBCUTANEOUS
  Administered 2015-08-12: 5 [IU] via SUBCUTANEOUS

## 2015-08-11 MED ORDER — CO Q 10 100 MG PO CAPS: 1.0000 | ORAL_CAPSULE | Freq: Every day | ORAL | Status: DC

## 2015-08-11 MED ORDER — VITAMIN D 1000 UNITS PO TABS
1000.0000 [IU] | ORAL_TABLET | Freq: Every day | ORAL | Status: DC
  Administered 2015-08-12: 1000 [IU] via ORAL
  Filled 2015-08-11 (×2): qty 1

## 2015-08-11 MED ORDER — METHYLPREDNISOLONE SODIUM SUCC 125 MG IJ SOLR
60.0000 mg | Freq: Two times a day (BID) | INTRAMUSCULAR | Status: DC
  Administered 2015-08-12: 60 mg via INTRAVENOUS
  Filled 2015-08-11: qty 2

## 2015-08-11 MED ORDER — DM-GUAIFENESIN ER 30-600 MG PO TB12
1.0000 | ORAL_TABLET | Freq: Two times a day (BID) | ORAL | Status: DC
  Administered 2015-08-12 (×2): 1 via ORAL
  Filled 2015-08-11 (×5): qty 1

## 2015-08-11 MED ORDER — SIMVASTATIN 10 MG PO TABS
10.0000 mg | ORAL_TABLET | Freq: Every day | ORAL | Status: DC
  Administered 2015-08-12: 10 mg via ORAL
  Filled 2015-08-11 (×2): qty 1

## 2015-08-11 MED ORDER — DEXTROSE 5 % IV SOLN
500.0000 mg | Freq: Once | INTRAVENOUS | Status: AC
  Administered 2015-08-11: 500 mg via INTRAVENOUS
  Filled 2015-08-11: qty 500

## 2015-08-11 MED ORDER — ALBUTEROL SULFATE (2.5 MG/3ML) 0.083% IN NEBU: 2.5000 mg | INHALATION_SOLUTION | RESPIRATORY_TRACT | Status: DC | PRN

## 2015-08-11 MED ORDER — ADULT MULTIVITAMIN W/MINERALS CH
1.0000 | ORAL_TABLET | Freq: Every day | ORAL | Status: DC
  Administered 2015-08-12 (×2): 1 via ORAL
  Filled 2015-08-11 (×3): qty 1

## 2015-08-11 MED ORDER — DEXTROSE 5 % IV SOLN
500.0000 mg | INTRAVENOUS | Status: DC
  Administered 2015-08-12: 500 mg via INTRAVENOUS
  Filled 2015-08-11: qty 500

## 2015-08-11 MED ORDER — ALPRAZOLAM 0.5 MG PO TABS: 0.5000 mg | ORAL_TABLET | ORAL | Status: DC | PRN

## 2015-08-11 MED ORDER — ASPIRIN 81 MG PO CHEW
81.0000 mg | CHEWABLE_TABLET | Freq: Every day | ORAL | Status: DC
  Administered 2015-08-12: 81 mg via ORAL
  Filled 2015-08-11: qty 1

## 2015-08-11 MED ORDER — CALCIUM CARBONATE 1250 (500 CA) MG PO TABS
1250.0000 mg | ORAL_TABLET | Freq: Two times a day (BID) | ORAL | Status: DC
  Administered 2015-08-12: 1250 mg via ORAL
  Filled 2015-08-11 (×4): qty 1

## 2015-08-11 MED ORDER — SODIUM CHLORIDE 0.9 % IV SOLN
INTRAVENOUS | Status: DC
  Administered 2015-08-11 – 2015-08-12 (×2): via INTRAVENOUS

## 2015-08-11 NOTE — ED Notes (Signed)
Per [facility] pt from home, co SOB started x 2 weeks , cough yellow sputum  x 2 weeks. Attempted to see PCP with no success. Denies fever nor weakness. Yet reports decrease in appetite. Alert and oriented x 4. Pt received 5 mg Albuterol neb, 125 mg solumedrol IV. Initial O2 sat 89% RA, wheezing upper lobes bilaterally per EMS.

## 2015-08-11 NOTE — H&P (Addendum)
[facility] History and Physical  Lindsey Weber JKK:[id number] DOB: 05/24/1941 DOA: 08/01/2015  Referring physician: ED physician PCP: [name], MD  Specialists:   Chief Complaint: Productive cough and shortness of breath  HPI: Lindsey Weber is a 75 y.o. female with PMH of tobacco abuse (quit 3 weeks ago), COPD, hypertension, hyperlipidemia, diabetes mellitus, GERD, anxiety, bilateral carotid artery stenosis, PVD, aortic stenosis, chronic kidney disease-3, chronic diastolic congestive heart failure, who presents with productive cough and shortness of breath.  Patient reports that she has been having cough and shortness of breath in the past 2 weeks. She coughs up yellow colored sputum. She has mild right upper pleuritic chest pain, which is induced by coughing. Patient denies fever or chills. She also has runny nose, but no sore throat. She reports that she had mild diarrhea on Monday, which has completely resolved. Currently no abdominal pain, diarrhea, symptoms of UTI or unilateral weakness. Patient does not have leg edema. No unilateral weakness.  In ED, patient was found to have WBC 28.4, lactate 1.79, troponin 0.017, BNP 1918, normal temperature, tachypnea, soft blood pressure, oxygen desaturation to 86% on room air, worsening renal function. Abnormal liver function with ALP 386, albumin 2.2, AST 79, ALT 22, protein 7.4, total bilirubin 0.8. CXR showed right lower lobe infiltration with possible subpulmonic pleural effusion. Patient is admitted to inpatient for further evaluation and treatment.  EKG: Independently reviewed.  QTC 457, nonspecific T-wave changes  Where does patient live?   At home   Can patient participate in ADLs?  Some   Review of Systems:  General: no fevers, chills, no changes in body weight, has poor appetite, has fatigue HEENT: no blurry vision, hearing changes or sore throat Pulm: has dyspnea, coughing, no wheezing CV: has chest pain, no  palpitations Abd: no nausea, vomiting, abdominal pain, diarrhea, constipation GU: no dysuria, burning on urination, increased urinary frequency, hematuria  Ext: no leg edema Neuro: no unilateral weakness, numbness, or tingling, no vision change or hearing loss Skin: no rash MSK: No muscle spasm, no deformity, no limitation of range of movement in spin Heme: No easy bruising.  Travel history: No recent long distant travel.  Allergy: No Known Allergies  Past Medical History  Diagnosis Date  . Hypertension   . Hyperlipemia   . COPD (chronic obstructive pulmonary disease) ([place])   . Carotid stenosis, bilateral   . Peripheral vascular disease ([place])   . Aortic valve stenosis 08/29/2013  . Anxiety   . GERD (gastroesophageal reflux disease)   . Diabetes mellitus without complication ([place])     type 2  . Heart murmur   . 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 Bladder tack    . Tee without cardioversion N/A 08/28/2013    Procedure: TRANSESOPHAGEAL ECHOCARDIOGRAM (TEE);  Surgeon: Dorothy Spark, MD;  Location[facility] ENDOSCOPY;  Service: Cardiovascular;  Laterality: N/A;  . Vaginal hysterectomy  1973    PARTIAL- states she had an "event with her heart" during that surgery   . Orif tibia plateau Right 09/15/2013    Procedure: OPEN REDUCTION INTERNAL FIXATION (ORIF) RIGHT TIBIAL PLATEAU AND SHAFT ;  Surgeon: Rozanna Box, MD;  Location: [facility];  Service: Orthopedics;  Laterality: Right;    Social History:  reports that she has been smoking Cigarettes.  She has a 29 pack-year smoking history. She has never used smokeless tobacco. She reports that  she does not drink alcohol or use illicit drugs.  Family History:  Family History  Problem Relation Age of Onset  . Dementia Mother   . Heart disease Mother   . Diabetes Father      Prior to Admission medications   Medication Sig Start Date End Date Taking? Authorizing Provider   ALPRAZolam Duanne Moron) 0.5 MG tablet Take 0.5 mg by mouth as needed for anxiety.   Yes Historical Provider, MD  amLODipine (NORVASC) 10 MG tablet Take 10 mg by mouth daily.   Yes Historical Provider, MD  Ascorbic Acid (VITAMIN C) 1000 MG tablet Take 1,000 mg by mouth daily.   Yes Historical Provider, MD  aspirin 81 MG chewable tablet Chew 1 tablet (81 mg total) by mouth daily. 08/29/13  Yes Ainsley Spinner, PA-C  calcium carbonate (OS-CAL) 600 MG TABS tablet Take 600 mg by mouth 2 (two) times daily with a meal.   Yes Historical Provider, MD  Cholecalciferol 1000 UNITS capsule Take 1 capsule (1,000 Units total) by mouth daily. 09/17/13  Yes Ainsley Spinner, PA-C  Coenzyme Q10 (CO Q 10) 100 MG CAPS Take 1 capsule by mouth daily.   Yes Historical Provider, MD  glipiZIDE (GLUCOTROL XL) 5 MG 24 hr tablet Take 5 mg by mouth daily with breakfast.   Yes Historical Provider, MD  Multiple Vitamins-Minerals (MULTIVITAMIN WITH MINERALS) tablet Take 1 tablet by mouth daily.   Yes Historical Provider, MD  omega-3 acid ethyl esters (LOVAZA) 1 G capsule Take 1 g by mouth daily.   Yes Historical Provider, MD  simvastatin (ZOCOR) 10 MG tablet Take 10 mg by mouth daily.   Yes Historical Provider, MD  amLODipine (NORVASC) 5 MG tablet Take 1 tablet (5 mg total) by mouth daily. Patient not taking: Reported on 07/25/2015 08/30/13   Kinnie Feil, MD  glipiZIDE (GLUCOTROL) 5 MG tablet Take 0.5 tablets (2.5 mg total) by mouth daily before breakfast. Patient not taking: Reported on 08/05/2015 08/30/13   Kinnie Feil, MD    Physical Exam: Filed Vitals:   07/25/2015 2130 08/10/2015 2200 08/16/2015 2303 08/10/2015 2319  BP: 118/60 116/105  113/48  Pulse: 85 85  85  Temp:      TempSrc:      Resp: 28 27  32  SpO2: 95% 95% 96% 98%   General: Not in acute distress HEENT:       Eyes: PERRL, EOMI, no scleral icterus.       ENT: No discharge from the ears and nose, no pharynx injection, no tonsillar enlargement.        Neck: No JVD, no bruit,  no mass felt. Heme: No neck lymph node enlargement. Cardiac: F8/B0, RRR, 2/6 systolic murmurs, No gallops or rubs. Pulm: decreased air movement bilaterally. Has rhonchi bilaterally, has crackles over left side posteriorly. Abd: Soft, nondistended, nontender, no rebound pain, no organomegaly, BS present. Ext: No pitting leg edema bilaterally. 2+DP/PT pulse bilaterally. Musculoskeletal: No joint deformities, No joint redness or warmth, no limitation of ROM in spin. Skin: No rashes.  Neuro: Alert, oriented X3, cranial nerves II-XII grossly intact, moves all extremities normally Psych: Patient is not psychotic, no suicidal or hemocidal ideation.  Labs on Admission:  Basic Metabolic Panel:  Recent Labs Lab 08/19/2015 2010  NA 138  K 4.5  CL 104  CO2 20*  GLUCOSE 153*  BUN 57*  CREATININE 2.40*  CALCIUM 9.2   Liver Function Tests:  Recent Labs Lab 07/28/2015 2010  AST 79*  ALT 92*  ALKPHOS 386*  BILITOT 0.8  PROT 7.4  ALBUMIN 2.2*   No results for input(s): LIPASE, AMYLASE in the last 168 hours. No results for input(s): AMMONIA in the last 168 hours. CBC:  Recent Labs Lab 08/04/2015 2010  WBC 28.4*  NEUTROABS 26.4*  HGB 7.9*  HCT 25.3*  MCV 78.6  PLT 802*   Cardiac Enzymes:  Recent Labs Lab 08/24/2015 2010  TROPONINI 0.17*    BNP (last 3 results)  Recent Labs  08/19/2015 2010  BNP 1918.0*    ProBNP (last 3 results) No results for input(s): PROBNP in the last 8760 hours.  CBG: No results for input(s): GLUCAP in the last 168 hours.  Radiological Exams on Admission: Dg Chest Port 1 View  08/01/2015  CLINICAL DATA:  Cough for 3 weeks with increasing shortness of breath over the last 3 days. Ex-smoker with history of hypertension and COPD. EXAM: PORTABLE CHEST 1 VIEW COMPARISON:  09/15/2013 and 08/27/2013. FINDINGS: 1946 hours. Mild patient rotation to the right. There is volume loss in the right hemithorax with opacification inferiorly. The aerated lungs  demonstrate diffuse interstitial prominence, similar to the prior study. There is a possible right subpulmonic pleural effusion. The heart size and mediastinal contours are stable. IMPRESSION: New right lower lobe airspace disease with possible subpulmonic pleural effusion, suspicious for pneumonia. Followup PA and lateral chest X-ray is recommended in 3-4 weeks following trial of antibiotic therapy to ensure resolution and exclude underlying malignancy. Electronically Signed   By: Richardean Sale M.D.   On: 07/25/2015 20:11    Assessment/Plan Principal Problem:   Acute on chronic respiratory failure with hypoxia (HCC) Active Problems:   Peripheral vascular disease (HCC)   Carotid stenosis, bilateral   COPD exacerbation (HCC)   Hyperlipemia   Hypertension   Diabetes mellitus without complication ([place])   Aortic valve stenosis   Acute renal failure superimposed on stage 3 chronic kidney disease ([place])   CAP (community acquired pneumonia)   Sepsis ([place])   NSTEMI (non-ST elevated myocardial infarction) ([place])   Chronic diastolic (congestive) heart failure (HCC)   Abnormal liver function   Acute on chronic respiratory failure with hypoxia (HCC) due to combination of CAP and COPD exacerbation: Chest x-ray showed right lower lobe infiltration, plus productive cough, chest pain and leukocytosis, are consistent with pneumonia. Patient may also have COPD exacerbation which is difficult to separate.  - Will admit to SDU - IV Rocephin and azithromycin IV - Mucinex for cough  - DuoNeb, albuterol Neb prn for SOB - Urine legionella and S. pneumococcal antigen - Follow up blood culture x2, sputum culture, plus Flu pcr - Will get CT-chest without contrast for further evaluation to r/o other possibilities, such as malignancy. (CXR showed possible sub pulmonic pleural effusion) - check ABG  Addendum: CT-chest without contrast showed multifocal pneumon, R lower lobe collapse/ consolidation associated with  RIGHT lower lobe bronchus opacification. Small to moderate RIGHT pleural effusion. Lymphadenopathy (limited assessment by noncontrast CT), including 3.6 x 4.9 cm sub carinal conglomeration confluent with RIGHT lower lobe consolidation.  -Consulted PCCM, will see in morning. May call PCCM if pt deteriorates tonight.   CAP:  -as above  COPD exacerbation: -As above  Sepsis: Patient's septic on admission with leukocytosis, tachypnea. Her blood pressure is soft initialywhich improved to 116/105. hemamically stable currently. - will get Procalcitonin and trend lactic acid level per sepsis protocol - IVF: 100 mL per hour of NS (patient has a diastolic congestive heart failure with BNP 1918, limiting  aggressive IV fluids treatment)  Elevated trop and NSTMI: trop 0.17. This is likely due to demanding ischemia 2/2 sepsis. Her worsening renal function may have also contributed to poor troponin clearance. - cycle CE q6 x3 and repeat her EKG in the am  - Nitroglycerin, Morphine, and aspirin, Zocor - Risk factor stratification: will check FLP and A1C  - Consider cardiology consult in AM  HTN: Blood pressure is soft on admission -Hold amlodipine  HLD: Last LDL was not on record -Continue home medications: Zocor -Check FLP  DM-II: Last A1c 6.1 on 09/16/13, well controled. Patient is taking glipizide at home -SSI -Check A1c  AoCKD-III: Baseline Cre is 1.59 on 09/17/13, her Cre 2.4 is on admission. Likely due to prerenal secondary to dehydration. ATN is also possible due to hypotenstion. - IVF as above - Check FeNa - Follow up renal function by BMP - Avoid ACEI and NSAIDs  Chronic diastolic (congestive) heart failure ([place]): 2-D echo on 08/28/13 showed EF 55-60 percent with grade 1 diastolic dysfunction. Patient is not taking diuretics. Patient's BNP is elevated at 1918, but the patient does not have any leg edema, no obvious JVD. Her elevated BNP is likely due to worsening renal function. I think  patient does not have obvious CHF exacerbation. -Hold diuretics. -Continue aspirin -Observe volume status closely.  Abnormal liver function: ALT 386, albumin 2.2, AST 79, ALT 22, protein 7.4, total bilirubin 0.8. Unlear etiology. May be related to sepsis. -check hepatitis panel -Avoid medications with liver toxicity, such as tylenol  Anxiety: Stable, no suicidal or homicidal ideations. -Continue home medications: Xanax  Normocytic anemia: Hemoglobin was 9.8 on 09/17/13, which decreased to 7.9 today. She does not have bleeding tendency, no hematochezia or hematuria. -Anemia panel   DVT ppx: SQ Lovenox  Code Status: Full code Family Communication: None at bed side.      Disposition Plan: Admit to inpatient   Date of Service 07/26/2015    Ivor Costa [facility] Pager [phone]  If 7PM-7AM, please contact night-coverage [email or web address] Password [email or web address] 08/10/2015, 11:40 PM

## 2015-08-11 NOTE — Progress Notes (Signed)
PHARMACIST - PHYSICIAN ORDER COMMUNICATION  CONCERNING: P&T Medication Policy on Herbal Medications  DESCRIPTION:  This patient's order for:  Coenzyme Q  has been noted.  This product(s) is classified as an "herbal" or natural product. Due to a lack of definitive safety studies or FDA approval, nonstandard manufacturing practices, plus the potential risk of unknown drug-drug interactions while on inpatient medications, the Pharmacy and Therapeutics Committee does not permit the use of "herbal" or natural products of this type within [facility].   ACTION TAKEN: The pharmacy department is unable to verify this order at this time and your patient has been informed of this safety policy. Please reevaluate patient's clinical condition at discharge and address if the herbal or natural product(s) should be resumed at that time.  Leone Haven, PharmD

## 2015-08-11 NOTE — ED Provider Notes (Signed)
CSN: [id number]     Arrival date & time 08/24/2015  1845 History   First MD Initiated Contact with Patient 07/29/2015 1900     Chief Complaint  Patient presents with  . Shortness of Breath     (Consider location/radiation/quality/duration/timing/severity/associated sxs/prior Treatment) HPI Cough for 2 weeks. Productive of yellow sputum. Patient has had increasing shortness of breath. She denies fever. She reports some chest pain with harsh coughing. Shortness of breath got much worse today. She reports she couldn't breathe. Patient does smoke. She reports last cigarette was 3 weeks ago. She denies any known history of COPD. No home oxygen use and no home nebulizers. Patient reports significant improvement with DuoNeb administered by EMS. No vomiting. Small amount of loose stool. No abdominal pain. No lower extremity swelling or calf pain. Past Medical History  Diagnosis Date  . Hypertension   . Hyperlipemia   . Carotid stenosis, bilateral   . Peripheral vascular disease ([place])   . Aortic valve stenosis 08/29/2013  . Anxiety   . GERD (gastroesophageal reflux disease)   . Diabetes mellitus without complication ([place])     type 2  . Heart murmur   . 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 Bladder tack    . Tee without cardioversion N/A 08/28/2013    Procedure: TRANSESOPHAGEAL ECHOCARDIOGRAM (TEE);  Surgeon: Dorothy Spark, MD;  Location: [facility] ENDOSCOPY;  Service: Cardiovascular;  Laterality: N/A;  . Vaginal hysterectomy  1973    PARTIAL- states she had an "event with her heart" during that surgery   . Orif tibia plateau Right 09/15/2013    Procedure: OPEN REDUCTION INTERNAL FIXATION (ORIF) RIGHT TIBIAL PLATEAU AND SHAFT ;  Surgeon: Rozanna Box, MD;  Location: [facility];  Service: Orthopedics;  Laterality: Right;   Family History  Problem Relation Age of Onset  . Dementia Mother   . Heart disease Mother   . Diabetes Father     Social History  Substance Use Topics  . Smoking status: Current Every Day Smoker -- 0.50 packs/day for 58 years    Types: Cigarettes  . Smokeless tobacco: Never Used  . Alcohol Use: No   OB History    No data available     Review of Systems 10 Systems reviewed and are negative for acute change except as noted in the HPI.   Allergies  Review of patient's allergies indicates no known allergies.  Home Medications   Prior to Admission medications   Medication Sig Start Date End Date Taking? Authorizing Provider  ALPRAZolam Duanne Moron) 0.5 MG tablet Take 0.5 mg by mouth as needed for anxiety.   Yes Historical Provider, MD  amLODipine (NORVASC) 10 MG tablet Take 10 mg by mouth daily.   Yes Historical Provider, MD  Ascorbic Acid (VITAMIN C) 1000 MG tablet Take 1,000 mg by mouth daily.   Yes Historical Provider, MD  aspirin 81 MG chewable tablet Chew 1 tablet (81 mg total) by mouth daily. 08/29/13  Yes Ainsley Spinner, PA-C  calcium carbonate (OS-CAL) 600 MG TABS tablet Take 600 mg by mouth 2 (two) times daily with a meal.   Yes Historical Provider, MD  Cholecalciferol 1000 UNITS capsule Take 1 capsule (1,000 Units total) by mouth daily. 09/17/13  Yes Ainsley Spinner, PA-C  Coenzyme Q10 (CO Q 10) 100 MG CAPS Take 1 capsule by mouth daily.   Yes Historical Provider, MD  glipiZIDE (GLUCOTROL XL) 5  MG 24 hr tablet Take 5 mg by mouth daily with breakfast.   Yes Historical Provider, MD  Multiple Vitamins-Minerals (MULTIVITAMIN WITH MINERALS) tablet Take 1 tablet by mouth daily.   Yes Historical Provider, MD  omega-3 acid ethyl esters (LOVAZA) 1 G capsule Take 1 g by mouth daily.   Yes Historical Provider, MD  simvastatin (ZOCOR) 10 MG tablet Take 10 mg by mouth daily.   Yes Historical Provider, MD  amLODipine (NORVASC) 5 MG tablet Take 1 tablet (5 mg total) by mouth daily. Patient not taking: Reported on 08/09/2015 08/30/13   Kinnie Feil, MD  glipiZIDE (GLUCOTROL) 5 MG tablet Take 0.5 tablets (2.5 mg  total) by mouth daily before breakfast. Patient not taking: Reported on 08/01/2015 08/30/13   Kinnie Feil, MD   BP 116/44 mmHg  Pulse 89  Temp(Src) 96.8 F (36 C) (Axillary)  Resp 23  Ht '5\' 6"'$  (1.676 m)  Wt 133 lb 13.1 oz (60.7 kg)  BMI 21.61 kg/m2  SpO2 97% Physical Exam  Constitutional: She is oriented to person, place, and time. She appears well-developed and well-nourished.  Patient is alert and nontoxic. She has mild to moderate increased work of breathing at rest.  HENT:  Head: Normocephalic and atraumatic.  Mouth/Throat: Oropharynx is clear and moist.  Eyes: EOM are normal. Pupils are equal, round, and reactive to light.  Neck: Neck supple.  Cardiovascular: Normal rate, normal heart sounds and intact distal pulses.   Occasional ectopy.  Pulmonary/Chest:  Mild to moderate increased work of breathing at rest. Patient is speaking in full sentences. She has harsh cough is productive of pale yellow sputum. Coarse wheeze bilaterally.  Abdominal: Soft. Bowel sounds are normal. She exhibits no distension. There is no tenderness.  Musculoskeletal: Normal range of motion. She exhibits no edema or tenderness.  Neurological: She is alert and oriented to person, place, and time. She has normal strength. Coordination normal. GCS eye subscore is 4. GCS verbal subscore is 5. GCS motor subscore is 6.  Skin: Skin is warm, dry and intact.  Psychiatric: She has a normal mood and affect.    ED Course  Procedures (including critical care time) CRITICAL CARE Performed by: Charlesetta Shanks   Total critical care time:  minutes  Critical care time was exclusive of separately billable procedures and treating other patients.  Critical care was necessary to treat or prevent imminent or life-threatening deterioration.  Critical care was time spent personally by me on the following activities: development of treatment plan with patient and/or surrogate as well as nursing, discussions with  consultants, evaluation of patient's response to treatment, examination of patient, obtaining history from patient or surrogate, ordering and performing treatments and interventions, ordering and review of laboratory studies, ordering and review of radiographic studies, pulse oximetry and re-evaluation of patient's condition. Labs Review Labs Reviewed  COMPREHENSIVE METABOLIC PANEL - Abnormal; Notable for the following:    CO2 20 (*)    Glucose, Bld 153 (*)    BUN 57 (*)    Creatinine, Ser 2.40 (*)    Albumin 2.2 (*)    AST 79 (*)    ALT 92 (*)    Alkaline Phosphatase 386 (*)    GFR calc non Af Amer 19 (*)    GFR calc Af Amer 22 (*)    All other components within normal limits  BRAIN NATRIURETIC PEPTIDE - Abnormal; Notable for the following:    B Natriuretic Peptide 1918.0 (*)    All other components  within normal limits  TROPONIN I - Abnormal; Notable for the following:    Troponin I 0.17 (*)    All other components within normal limits  CBC WITH DIFFERENTIAL/PLATELET - Abnormal; Notable for the following:    WBC 28.4 (*)    RBC 3.22 (*)    Hemoglobin 7.9 (*)    HCT 25.3 (*)    MCH 24.5 (*)    Platelets 802 (*)    Neutro Abs 26.4 (*)    All other components within normal limits  VITAMIN B12 - Abnormal; Notable for the following:    Vitamin B-12 1455 (*)    All other components within normal limits  IRON AND TIBC - Abnormal; Notable for the following:    Iron 16 (*)    TIBC 193 (*)    Saturation Ratios 8 (*)    All other components within normal limits  FERRITIN - Abnormal; Notable for the following:    Ferritin 762 (*)    All other components within normal limits  RETICULOCYTES - Abnormal; Notable for the following:    RBC. 2.90 (*)    All other components within normal limits  LIPID PANEL - Abnormal; Notable for the following:    Triglycerides 214 (*)    HDL 29 (*)    VLDL 43 (*)    All other components within normal limits  TROPONIN I - Abnormal; Notable for the  following:    Troponin I 0.14 (*)    All other components within normal limits  TROPONIN I - Abnormal; Notable for the following:    Troponin I 0.11 (*)    All other components within normal limits  LACTIC ACID, PLASMA - Abnormal; Notable for the following:    Lactic Acid, Venous 3.5 (*)    All other components within normal limits  APTT - Abnormal; Notable for the following:    aPTT 21 (*)    All other components within normal limits  CBC - Abnormal; Notable for the following:    WBC 30.1 (*)    RBC 2.93 (*)    Hemoglobin 7.2 (*)    HCT 23.2 (*)    MCH 24.6 (*)    Platelets 723 (*)    All other components within normal limits  BLOOD GAS, ARTERIAL - Abnormal; Notable for the following:    pCO2 arterial 27.0 (*)    pO2, Arterial 79.9 (*)    Bicarbonate 17.5 (*)    Acid-base deficit 6.0 (*)    All other components within normal limits  TROPONIN I - Abnormal; Notable for the following:    Troponin I 9.31 (*)    All other components within normal limits  GLUCOSE, CAPILLARY - Abnormal; Notable for the following:    Glucose-Capillary 252 (*)    All other components within normal limits  GLUCOSE, CAPILLARY - Abnormal; Notable for the following:    Glucose-Capillary 209 (*)    All other components within normal limits  TROPONIN I - Abnormal; Notable for the following:    Troponin I 12.58 (*)    All other components within normal limits  GLUCOSE, CAPILLARY - Abnormal; Notable for the following:    Glucose-Capillary 233 (*)    All other components within normal limits  GLUCOSE, CAPILLARY - Abnormal; Notable for the following:    Glucose-Capillary 227 (*)    All other components within normal limits  CULTURE, BLOOD (ROUTINE X 2)  CULTURE, BLOOD (ROUTINE X 2)  MRSA PCR SCREENING  CULTURE, EXPECTORATED  SPUTUM-ASSESSMENT  GRAM STAIN  INFLUENZA PANEL BY PCR (TYPE A & B, H1N1)  CREATININE, URINE, RANDOM  SODIUM, URINE, RANDOM  FOLATE  LACTIC ACID, PLASMA  PROCALCITONIN   PROTIME-INR  STREP PNEUMONIAE URINARY ANTIGEN  TSH  HEPATITIS PANEL, ACUTE  HEMOGLOBIN A1C  LEGIONELLA ANTIGEN, URINE  OCCULT BLOOD X 1 CARD TO LAB, STOOL  CK TOTAL AND CKMB (NOT AT [facility])  BLOOD GAS, ARTERIAL  HEMOGLOBIN A1C  CBC WITH DIFFERENTIAL/PLATELET  COMPREHENSIVE METABOLIC PANEL  TROPONIN I  I-STAT CG4 LACTIC ACID, ED  TYPE AND SCREEN  PREPARE RBC (CROSSMATCH)  SURGICAL PATHOLOGY  CYTOLOGY - NON PAP  CYTOLOGY - NON PAP  CYTOLOGY - NON PAP    Imaging Review Ct Chest Wo Contrast  08/01/2015  CLINICAL DATA:  Shortness of breath for 2 weeks. History of pneumonia, follow-up abnormal chest radiograph. History of hypertension, hyperlipidemia, diabetes. EXAM: CT CHEST WITHOUT CONTRAST TECHNIQUE: Multidetector CT imaging of the chest was performed following the standard protocol without IV contrast. COMPARISON:  Chest radiograph August 11, 2015 FINDINGS: Mediastinum: Heart size is mildly enlarged. Moderate coronary artery calcifications. No pericardial effusion. Thoracic aorta is normal course and caliber with moderate to severe calcific atherosclerosis. Sub carinal 3.6 x 4.9 cm lymphadenopathy, confluent with RIGHT lower lobe consolidation. Precarinal 14 mm lymph node. 12 mm pretracheal lymph node. Lungs: Complete consolidation the RIGHT lower lobe associated with effaced bronchus, superimposed scattered calcifications. Small to moderate effusion with subpulmonic component. 5 mm RIGHT upper lobe ground-glass nodule. Additional scattered ground-glass and nodular consolidations scattered throughout the lungs bilaterally. No LEFT pleural effusion. No pneumothorax. Abdomen:  No acute process. Soft tissues and osseous structures:  Nonacute. IMPRESSION: RIGHT lower lobe collapse/ consolidation associated with RIGHT lower lobe bronchus opacification. Recommend of bronchoscopy to exclude endobronchial mass causing postobstructive pneumonia. Small to moderate RIGHT pleural effusion. Additional  patchy consolidations throughout the lungs compatible with multifocal pneumonia, less likely septic emboli. Lymphadenopathy (limited assessment by noncontrast CT), including 3.6 x 4.9 cm sub carinal conglomeration confluent with RIGHT lower lobe consolidation. Though these may be reactive, neoplasm is a concern. Electronically Signed   By: Elon Alas M.D.   On: 07/25/2015 00:55   Dg Chest Port 1 View  08/06/2015  CLINICAL DATA:  Shortness of breath. EXAM: PORTABLE CHEST 1 VIEW COMPARISON:  Same day. FINDINGS: Stable cardiomediastinal silhouette. No pneumothorax is noted. Increased bilateral interstitial densities are noted throughout both lungs most consistent with edema or possibly pneumonia. Right basilar opacity is noted concerning for pneumonia or atelectasis with associated pleural effusion. Bony thorax is unremarkable. IMPRESSION: Increased bilateral interstitial densities are noted concerning for edema or possibly pneumonia. Stable right basilar opacity is noted concerning for pneumonia or atelectasis with associated pleural effusion. Electronically Signed   By: Marijo Conception, M.D.   On: 08/16/2015 17:02   Dg Chest Port 1 View  07/28/2015  CLINICAL DATA:  Status post bronchoscopy with biopsy. EXAM: PORTABLE CHEST 1 VIEW COMPARISON:  Chest CT earlier today.  Chest radiograph 08/01/2015. FINDINGS: The patient is rotated to the right. The cardiomediastinal silhouette is grossly unchanged. Right lung volume loss is unchanged. Opacification of the right lower hemithorax is unchanged and consistent with underlying right lower lobe collapse/ consolidation and small to moderate-sized right pleural effusion as seen on CT. Additional patchy and nodular opacities elsewhere in both lungs are better seen on CT. There is mild diffuse interstitial prominence which is similar to the prior radiograph. No pneumothorax is identified. IMPRESSION: Unchanged right lower lobe  collapse/consolidation and pleural  effusion. No pneumothorax identified. Electronically Signed   By: Logan Bores M.D.   On: 08/09/2015 13:17   Dg Chest Port 1 View  08/02/2015  CLINICAL DATA:  Cough for 3 weeks with increasing shortness of breath over the last 3 days. Ex-smoker with history of hypertension and COPD. EXAM: PORTABLE CHEST 1 VIEW COMPARISON:  09/15/2013 and 08/27/2013. FINDINGS: 1946 hours. Mild patient rotation to the right. There is volume loss in the right hemithorax with opacification inferiorly. The aerated lungs demonstrate diffuse interstitial prominence, similar to the prior study. There is a possible right subpulmonic pleural effusion. The heart size and mediastinal contours are stable. IMPRESSION: New right lower lobe airspace disease with possible subpulmonic pleural effusion, suspicious for pneumonia. Followup PA and lateral chest X-ray is recommended in 3-4 weeks following trial of antibiotic therapy to ensure resolution and exclude underlying malignancy. Electronically Signed   By: Richardean Sale M.D.   On: 08/04/2015 20:11   I have personally reviewed and evaluated these images and lab results as part of my medical decision-making.   EKG Interpretation   Date/Time:  Wednesday August 11 2015 22:25:54 EST Ventricular Rate:  86 PR Interval:  139 QRS Duration: 110 QT Interval:  376 QTC Calculation: 450 R Axis:   66 Text Interpretation:  Sinus arrhythmia Minimal ST depression,  anterolateral leads ED PHYSICIAN INTERPRETATION AVAILABLE IN [place]  [place] Confirmed by TEST, Record ([id number]) on 07/25/2015 6:41:51 AM      MDM   Final diagnoses:  CAP (community acquired pneumonia)  S/P bronchoscopy with biopsy  SOB (shortness of breath)   Patient presents hypoxic in significant illness. She has had productive cough and treatment as been initiated for community-acquired pneumonia. She also has  congestive heart failure. She presents with complex illness and high risk for deterioration. At this time  with provided treatment the patient reports feeling significantly improved.    Charlesetta Shanks, MD 2015-08-15 0120

## 2015-08-11 NOTE — ED Notes (Signed)
Placed on bedside commode.

## 2015-08-11 NOTE — ED Notes (Signed)
MD at bedside. 

## 2015-08-12 ENCOUNTER — Inpatient Hospital Stay (HOSPITAL_COMMUNITY): Payer: Medicare Other

## 2015-08-12 ENCOUNTER — Encounter (HOSPITAL_COMMUNITY): Admission: EM | Disposition: E | Payer: Self-pay | Source: Home / Self Care | Attending: Internal Medicine

## 2015-08-12 ENCOUNTER — Encounter (HOSPITAL_COMMUNITY): Payer: Self-pay | Admitting: Pulmonary Disease

## 2015-08-12 DIAGNOSIS — R222 Localized swelling, mass and lump, trunk: Secondary | ICD-10-CM

## 2015-08-12 DIAGNOSIS — K219 Gastro-esophageal reflux disease without esophagitis: Secondary | ICD-10-CM | POA: Diagnosis present

## 2015-08-12 DIAGNOSIS — J181 Lobar pneumonia, unspecified organism: Secondary | ICD-10-CM

## 2015-08-12 DIAGNOSIS — Z720 Tobacco use: Secondary | ICD-10-CM

## 2015-08-12 DIAGNOSIS — I214 Non-ST elevation (NSTEMI) myocardial infarction: Secondary | ICD-10-CM

## 2015-08-12 DIAGNOSIS — R918 Other nonspecific abnormal finding of lung field: Secondary | ICD-10-CM | POA: Diagnosis present

## 2015-08-12 DIAGNOSIS — J9601 Acute respiratory failure with hypoxia: Secondary | ICD-10-CM

## 2015-08-12 DIAGNOSIS — I509 Heart failure, unspecified: Secondary | ICD-10-CM

## 2015-08-12 DIAGNOSIS — J9621 Acute and chronic respiratory failure with hypoxia: Secondary | ICD-10-CM

## 2015-08-12 DIAGNOSIS — D509 Iron deficiency anemia, unspecified: Secondary | ICD-10-CM | POA: Diagnosis present

## 2015-08-12 HISTORY — PX: VIDEO BRONCHOSCOPY: SHX5072

## 2015-08-12 LAB — STREP PNEUMONIAE URINARY ANTIGEN: Strep Pneumo Urinary Antigen: NEGATIVE

## 2015-08-12 LAB — CBC
HEMATOCRIT: 23.2 % — AB (ref 36.0–46.0)
HEMOGLOBIN: 7.2 g/dL — AB (ref 12.0–15.0)
MCH: 24.6 pg — ABNORMAL LOW (ref 26.0–34.0)
MCHC: 31 g/dL (ref 30.0–36.0)
MCV: 79.2 fL (ref 78.0–100.0)
Platelets: 723 10*3/uL — ABNORMAL HIGH (ref 150–400)
RBC: 2.93 MIL/uL — ABNORMAL LOW (ref 3.87–5.11)
RDW: 15.1 % (ref 11.5–15.5)
WBC: 30.1 10*3/uL — ABNORMAL HIGH (ref 4.0–10.5)

## 2015-08-12 LAB — GLUCOSE, CAPILLARY
GLUCOSE-CAPILLARY: 209 mg/dL — AB (ref 65–99)
Glucose-Capillary: 252 mg/dL — ABNORMAL HIGH (ref 65–99)
Glucose-Capillary: 233 mg/dL — ABNORMAL HIGH (ref 65–99)
Glucose-Capillary: 227 mg/dL — ABNORMAL HIGH (ref 65–99)

## 2015-08-12 LAB — TSH: TSH: 1.91 u[IU]/mL (ref 0.350–4.500)

## 2015-08-12 LAB — APTT: aPTT: 21 seconds — ABNORMAL LOW (ref 24–37)

## 2015-08-12 LAB — INFLUENZA PANEL BY PCR (TYPE A & B)
H1N1FLUPCR: NOT DETECTED
Influenza A By PCR: NEGATIVE
Influenza B By PCR: NEGATIVE

## 2015-08-12 LAB — TROPONIN I
Troponin I: 0.14 ng/mL — ABNORMAL HIGH (ref <0.031)
Troponin I: 0.11 ng/mL — ABNORMAL HIGH (ref <0.031)
Troponin I: 12.58 ng/mL (ref <0.031)

## 2015-08-12 LAB — RETICULOCYTES
RBC.: 2.9 MIL/uL — AB (ref 3.87–5.11)
RETIC COUNT ABSOLUTE: 89.9 10*3/uL (ref 19.0–186.0)
Retic Ct Pct: 3.1 % (ref 0.4–3.1)

## 2015-08-12 LAB — FOLATE: FOLATE: 15.9 ng/mL (ref >5.9)

## 2015-08-12 LAB — LIPID PANEL
Cholesterol: 138 mg/dL (ref 0–200)
HDL: 29 mg/dL — AB (ref >40)
LDL Cholesterol: 66 mg/dL (ref 0–99)
TRIGLYCERIDES: 214 mg/dL — AB (ref <150)
Total CHOL/HDL Ratio: 4.8 RATIO
VLDL: 43 mg/dL — ABNORMAL HIGH (ref 0–40)

## 2015-08-12 LAB — FERRITIN: Ferritin: 762 ng/mL — ABNORMAL HIGH (ref 11–307)

## 2015-08-12 LAB — PROCALCITONIN: PROCALCITONIN: 0.95 ng/mL

## 2015-08-12 LAB — PROTIME-INR
INR: 1.1 (ref 0.00–1.49)
PROTHROMBIN TIME: 14.4 s (ref 11.6–15.2)

## 2015-08-12 LAB — MRSA PCR SCREENING: MRSA by PCR: NEGATIVE

## 2015-08-12 LAB — LACTIC ACID, PLASMA
LACTIC ACID, VENOUS: 2 mmol/L (ref 0.5–2.0)
LACTIC ACID, VENOUS: 3.5 mmol/L — AB (ref 0.5–2.0)

## 2015-08-12 LAB — PREPARE RBC (CROSSMATCH)

## 2015-08-12 LAB — IRON AND TIBC
Iron: 16 ug/dL — ABNORMAL LOW (ref 28–170)
Saturation Ratios: 8 % — ABNORMAL LOW (ref 10.4–31.8)
TIBC: 193 ug/dL — ABNORMAL LOW (ref 250–450)
UIBC: 177 ug/dL

## 2015-08-12 LAB — CREATININE, URINE, RANDOM: CREATININE, URINE: 101.56 mg/dL

## 2015-08-12 LAB — VITAMIN B12: VITAMIN B 12: 1455 pg/mL — AB (ref 180–914)

## 2015-08-12 LAB — SODIUM, URINE, RANDOM: SODIUM UR: 13 mmol/L

## 2015-08-12 SURGERY — VIDEO BRONCHOSCOPY WITHOUT FLUORO
Anesthesia: Moderate Sedation | Laterality: Bilateral

## 2015-08-12 MED ORDER — MIDAZOLAM HCL 10 MG/2ML IJ SOLN
INTRAMUSCULAR | Status: DC | PRN
  Administered 2015-08-12 (×3): 2 mg via INTRAVENOUS

## 2015-08-12 MED ORDER — LIDOCAINE HCL 2 % EX GEL: 1.0000 "application " | Freq: Once | CUTANEOUS | Status: DC

## 2015-08-12 MED ORDER — FENTANYL CITRATE (PF) 100 MCG/2ML IJ SOLN
INTRAMUSCULAR | Status: AC
  Filled 2015-08-12: qty 4

## 2015-08-12 MED ORDER — METRONIDAZOLE IN NACL 5-0.79 MG/ML-% IV SOLN
500.0000 mg | Freq: Three times a day (TID) | INTRAVENOUS | Status: DC
  Administered 2015-08-12 (×2): 500 mg via INTRAVENOUS
  Filled 2015-08-12 (×2): qty 100

## 2015-08-12 MED ORDER — BUTAMBEN-TETRACAINE-BENZOCAINE 2-2-14 % EX AERO: 1.0000 | INHALATION_SPRAY | Freq: Once | CUTANEOUS | Status: DC

## 2015-08-12 MED ORDER — PANTOPRAZOLE SODIUM 40 MG PO TBEC
40.0000 mg | DELAYED_RELEASE_TABLET | Freq: Every day | ORAL | Status: DC
  Administered 2015-08-12: 40 mg via ORAL
  Filled 2015-08-12: qty 1

## 2015-08-12 MED ORDER — METOPROLOL TARTRATE 1 MG/ML IV SOLN
2.5000 mg | Freq: Four times a day (QID) | INTRAVENOUS | Status: DC
  Administered 2015-08-12: 2.5 mg via INTRAVENOUS
  Filled 2015-08-12: qty 5

## 2015-08-12 MED ORDER — PANTOPRAZOLE SODIUM 40 MG IV SOLR: 40.0000 mg | INTRAVENOUS | Status: DC

## 2015-08-12 MED ORDER — FUROSEMIDE 10 MG/ML IJ SOLN
40.0000 mg | Freq: Once | INTRAMUSCULAR | Status: AC
  Administered 2015-08-12: 40 mg via INTRAVENOUS
  Filled 2015-08-12: qty 4

## 2015-08-12 MED ORDER — LORAZEPAM 2 MG/ML IJ SOLN
0.5000 mg | Freq: Three times a day (TID) | INTRAMUSCULAR | Status: DC | PRN
  Administered 2015-08-12: 0.5 mg via INTRAVENOUS
  Filled 2015-08-12: qty 1

## 2015-08-12 MED ORDER — SODIUM CHLORIDE 0.9 % IV BOLUS (SEPSIS)
500.0000 mL | Freq: Once | INTRAVENOUS | Status: AC
  Administered 2015-08-12: 500 mL via INTRAVENOUS

## 2015-08-12 MED ORDER — PHENYLEPHRINE HCL 0.25 % NA SOLN: 1.0000 | Freq: Four times a day (QID) | NASAL | Status: DC | PRN

## 2015-08-12 MED ORDER — METHYLPREDNISOLONE SODIUM SUCC 40 MG IJ SOLR
20.0000 mg | Freq: Three times a day (TID) | INTRAMUSCULAR | Status: DC
  Administered 2015-08-12 (×2): 20 mg via INTRAVENOUS
  Filled 2015-08-12 (×2): qty 1

## 2015-08-12 MED ORDER — ACETAMINOPHEN 325 MG PO TABS
650.0000 mg | ORAL_TABLET | ORAL | Status: DC | PRN
  Administered 2015-08-12: 650 mg via ORAL
  Filled 2015-08-12: qty 2

## 2015-08-12 MED ORDER — SODIUM CHLORIDE 0.9 % IV SOLN
Freq: Once | INTRAVENOUS | Status: AC
  Administered 2015-08-12: 11:00:00 via INTRAVENOUS

## 2015-08-12 MED ORDER — FUROSEMIDE 10 MG/ML IJ SOLN
20.0000 mg | Freq: Once | INTRAMUSCULAR | Status: AC
  Administered 2015-08-12: 20 mg via INTRAVENOUS
  Filled 2015-08-12: qty 2

## 2015-08-12 MED ORDER — FENTANYL CITRATE (PF) 100 MCG/2ML IJ SOLN
INTRAMUSCULAR | Status: DC | PRN
  Administered 2015-08-12 (×2): 50 ug via INTRAVENOUS

## 2015-08-12 MED ORDER — MIDAZOLAM HCL 5 MG/ML IJ SOLN
INTRAMUSCULAR | Status: AC
  Filled 2015-08-12: qty 2

## 2015-08-12 MED ORDER — PIPERACILLIN-TAZOBACTAM IN DEX 2-0.25 GM/50ML IV SOLN
2.2500 g | Freq: Four times a day (QID) | INTRAVENOUS | Status: DC
  Administered 2015-08-12: 2.25 g via INTRAVENOUS
  Filled 2015-08-12 (×2): qty 50

## 2015-08-12 MED ORDER — LIDOCAINE HCL 1 % IJ SOLN
INTRAMUSCULAR | Status: DC | PRN
  Administered 2015-08-12: 6 mL via RESPIRATORY_TRACT

## 2015-08-12 NOTE — Progress Notes (Signed)
Patient SpO2 80 on NRB mask. Continues to refuse BiPAP and ABG. Follows commands and understands death may occur if she continues to decline treatment. RN aware. MD paged.

## 2015-08-12 NOTE — Progress Notes (Signed)
Video Bronchoscppy done  Intervention Bronchial washing done Intervention Bronchial biopsy Intervention Bronchial Needle Aspiration done  Procedure tolerated

## 2015-08-12 NOTE — Progress Notes (Signed)
Pt states that she takes Tylenol at home to help her calm down.  MD made aware.   Will continue to monitor.

## 2015-08-12 NOTE — Care Management Note (Signed)
Case Management Note  Patient Details  Name: Lindsey Weber MRN: [id number] Date of Birth: 01/19/1941  Subjective/Objective:             Multi-lobular pna, hypotensive and sepsis       Action/Plan:Date: August 12, 2015 Chart reviewed for concurrent status and case management needs. Will continue to follow patient for changes and needs: Lindsey Harman, RN, BSN, [place]   [phone]   Expected Discharge Date:   (unknown)               Expected Discharge Plan:  Home/Self Care  In-House Referral:  NA  Discharge planning Services  CM Consult  Post Acute Care Choice:  NA Choice offered to:  NA  DME Arranged:    DME Agency:     HH Arranged:    HH Agency:     Status of Service:  In process, will continue to follow  Medicare Important Message Given:    Date Medicare IM Given:    Medicare IM give by:    Date Additional Medicare IM Given:    Additional Medicare Important Message give by:     If discussed at [place] of Stay Meetings, dates discussed:    Additional Comments:  Lindsey Cha, RN 08/23/2015, 11:34 AM

## 2015-08-12 NOTE — Progress Notes (Signed)
Patient placed on Bipap

## 2015-08-12 NOTE — Progress Notes (Addendum)
Patient became very short of breath and had increased work of breathing at approximately 1615 this afternoon. She was not complaining of having any chest pain. She did say she was having tightness in her chest. She was on 6L [place] at that time, and she was satting around 87%.   She was then placed on the non-rebreather mask. O2 sats began increasing. Upon ascultation her lungs sounded crackly and there were wheezes present.  The MD was notified and orders were placed.  Will continue to monitor.

## 2015-08-12 NOTE — Progress Notes (Signed)
CRITICAL VALUE ALERT  Critical value received:  Lactic acid 3.5  Date of notification:  08/19/2015  Time of notification:  0240  Critical value read back:yes  Nurse who received alert:  Doristine Devoid RN  MD notified (1st page):  [facility] [place]  Time of first page:  0243  MD notified (2nd page):N/A  Time of second page:N/A  Responding MD:  [place]  Time MD responded:  N/A

## 2015-08-12 NOTE — Procedures (Signed)
Bronchoscopy Procedure Note Lindsey Weber [id number] 09-12-1940  Procedure: Bronchoscopy Indications: 75 yo female smoker with post-obstructive pneumonia.  Procedure Details Consent: Risks of procedure as well as the alternatives and risks of each were explained to the (patient/caregiver).  Consent for procedure obtained. Time Out: Verified patient identification, verified procedure, site/side was marked, verified correct patient position, special equipment/implants available, medications/allergies/relevent history reviewed, required imaging and test results available.  Performed  She was brought to the bronchoscopy suite and given cetacaine spray.  Conscious sedation given by me >> 4 mg versed, 50 mcg fentanyl.  Start time 1210 pm, stop time 1250 pm.  Bronchoscope entered orally.  Vocal cords visualized with normal motion.  Instilled total of 8 ml of 1% lidocaine.  Bronchoscope entered into trachea and carina visualized.  Bronchoscope entered into the left main bronchus.  Left upper, lingular, and lower lobes all visualized.  Mucosa was friable, but no endobronchial lesions.  Bronchoscope was then entered into right main bronchus and right upper lobe visualized w/o endobronchial lesion.  There was narrowing of right bronchus intermedius with mass lesion obstructing airway to right middle and right lower lobes.  Endobronchial biopsies, needle aspiration, and BAL from this area were obtained.  Needle aspiration from subcarinal region also performed.  She had transient drop in oxygenation that improved with additional supplemental oxygen.  No other immediate complications.  Post procedure chest xray w/o pneumothorax  Disposition Will send endobronchial biopsies for surgical pathology Will send endobronchial/subcarinal needle aspiration, and BAL for cytology  Chesley Mires, MD [place] 08/14/2015, 1:37 PM Pager:  [phone] After 3pm call: [phone]

## 2015-08-12 NOTE — Progress Notes (Signed)
Initial Nutrition Assessment  DOCUMENTATION CODES:   Not applicable  INTERVENTION:   Diet advancement per MD RD to follow-up to complete assessment  NUTRITION DIAGNOSIS:   Increased nutrient needs related to  (COPD) as evidenced by estimated needs.  GOAL:   Patient will meet greater than or equal to 90% of their needs  MONITOR:   Diet advancement, Labs, Weight trends, Skin, I & O's  REASON FOR ASSESSMENT:   Consult COPD Protocol  ASSESSMENT:   75 yo female smoker brought to ER with 2 weeks of dyspnea, productive cough, and decreased appetite. She also c/o pleuritic type chest pain on Rt, rhinorrhea, and mild diarrhea. Noted to have wheezing in ER, and was hypoxic. She also had elevated lactic acid. CXR showed new RLL ASD and effusion. She was started on Abx of community acquired PNA. CT chest showed RLL collapse, subcarinal adenopathy, and possible endobronchial obstruction. She has also lost about 25 lbs.   Pt not available in room, at endoscopy getting bronchoscopy. Per H&P, pt has lost 20-25 over the last 3 months. Pt currently NPO for procedure. Suspect pt needs nutritional supplementation given increased needs and recent weight loss. Will assess for needs at follow-up. Unable to perform NFPE.  Labs reviewed: CBGs: 252 Elevated TG, BUN, Creatinine  Diet Order:  Diet NPO time specified Except for: Ice Chips, Sips with Meds  Skin:  Reviewed, no issues  Last BM:     Height:   Ht Readings from Last 1 Encounters:  07/29/2015 '5\' 6"'$  (1.676 m)    Weight:   Wt Readings from Last 1 Encounters:  08/15/2015 133 lb 13.1 oz (60.7 kg)    Ideal Body Weight:  59.1 kg  BMI:  Body mass index is 21.61 kg/(m^2).  Estimated Nutritional Needs:   Kcal:  1800-2000  Protein:  90-100g  Fluid:  1.9L/day  EDUCATION NEEDS:   No education needs identified at this time  Clayton Bibles, MS, RD, LDN Pager: [phone] After Hours Pager: [phone]

## 2015-08-12 NOTE — Progress Notes (Signed)
CRITICAL VALUE ALERT  Critical value received:  Troponin 9.31  Date of notification:  08/22/2015  Time of notification:  1519  Critical value read back:Yes.    Nurse who received alert:  Leanna Sato  MD notified (1st page): [place]  Time of first page: 1523  Time MD responded:  [id number]

## 2015-08-12 NOTE — Progress Notes (Signed)
Patient did not tolerate Bipap. MD was made aware.  Patient is currently on NRB and is satting 99%, and resting with her eyes closed.  Will continue to monitor.

## 2015-08-12 NOTE — Progress Notes (Addendum)
[facility] PROGRESS NOTE  ANITA LAGUNA CWC:[id number] DOB: 18-Oct-1940 DOA: 07/26/2015 PCP: [name], MD  Summary 08/01/2015: I have seen and examined Ms. Lindsey Weber at bedside and reviewed her chart. Appreciate pulmonary. AVO SCHLACHTER is a 75 y.o. female with PMH of tobacco abuse (?quit 3 weeks ago), COPD, hypertension, hyperlipidemia, diabetes mellitus, GERD, anxiety, bilateral carotid artery stenosis, PVD, aortic stenosis, chronic kidney disease-3, chronic diastolic congestive heart failure, who presented with productive cough and shortness of breath and she was found to have acute on chronic respiratory failure with hypoxia in setting of COPD exacerbation/pneumonia-CAP/postobstructive with suggestion of sepsis- white count 29,000, transaminitis, acute on chronic kidney injury. There is concern for bronchogenic CA. Patient also has microcytic anemia. White count is increased to 30,000, likely with steroid element. Hemoglobin 7.2 g/dl. Patient has no prior history of colonoscopy/EGD. She has lost more than 20 pounds in the last 3 months. Will add Flagyl for anaerobic coverage, obtain iron studies/stool Hemoccult, transfuse 1 unit PRBC for tight get hemoglobin greater than 8 g/dL given cardiac history, follow pulmonary recommendations. She will likely need GI once cardiopulmonary issues settled. Plan Acute on chronic respiratory failure with hypoxia (HCC)/COPD exacerbation (HCC)/CAP (community acquired pneumonia)/Sepsis ([place])  Day 2 ceftriaxone/Zithromax  Flagyl  Follow pulmonary recommendations  For FOB today Aortic valve stenosis/NSTEMI (non-ST elevated myocardial infarction) (HCC)/Chronic diastolic (congestive) heart failure (HCC)/Abnormal liver function/Peripheral vascular disease (HCC)/Carotid stenosis, bilateral/Hyperlipemia/Hypertension  On aspirin, for 2-D echocardiogram                                                 Microcytic anemia/GERD (gastroesophageal reflux  disease)  Iron studies  Stool Hemoccult  Transfuse 1 unit PRBC   Protonix  May need GI  Acute renal failure superimposed on stage 3 chronic kidney disease (HCC)  Monitor renal function  Further workup depending on clinical evolution Diabetes mellitus without complication ([place])  Check hemoglobin A1c  ssi    Code Status: DNR Family Communication: None at bedside Disposition Plan: To Be Determined   Consultants:  PCCM  Procedures:  FOB planned 07/27/2015  Antibiotics:  Ceftriaxone 08/10/2015>  Zithromax 08/21/2015>  Flagyl 07/30/2015>  HPI/Subjective: No specific complaints.  Objective: Filed Vitals:   07/31/2015 0600 08/16/2015 0753  BP: 124/38   Pulse: 87   Temp:  97.6 F (36.4 C)  Resp: 23     Intake/Output Summary (Last 24 hours) at 08/01/2015 0806 Last data filed at 08/09/2015 0600  Gross per 24 hour  Intake   1000 ml  Output      0 ml  Net   1000 ml   Filed Weights   07/31/2015 0223  Weight: 60.7 kg (133 lb 13.1 oz)    Exam:   General:  Comfortable at rest. Coughing.  Cardiovascular: S1-S2 normal. No murmurs. Pulse regular.  Respiratory: Good air entry bilaterally. No rhonchi or rales.  Abdomen: Soft and nontender. Normal bowel sounds. No organomegaly.  Musculoskeletal: No pedal edema   Neurological: Intact  Data Reviewed: Basic Metabolic Panel:  Recent Labs Lab 08/20/2015 2010  NA 138  K 4.5  CL 104  CO2 20*  GLUCOSE 153*  BUN 57*  CREATININE 2.40*  CALCIUM 9.2   Liver Function Tests:  Recent Labs Lab 08/08/2015 2010  AST 79*  ALT 92*  ALKPHOS 386*  BILITOT 0.8  PROT 7.4  ALBUMIN 2.2*  No results for input(s): LIPASE, AMYLASE in the last 168 hours. No results for input(s): AMMONIA in the last 168 hours. CBC:  Recent Labs Lab 08/15/2015 2010 08/20/2015 0012  WBC 28.4* 30.1*  NEUTROABS 26.4*  --   HGB 7.9* 7.2*  HCT 25.3* 23.2*  MCV 78.6 79.2  PLT 802* 723*   Cardiac Enzymes:  Recent Labs Lab 08/10/2015 2010  07/31/2015 0012  TROPONINI 0.17* 0.14*   BNP (last 3 results)  Recent Labs  08/02/2015 2010  BNP 1918.0*    ProBNP (last 3 results) No results for input(s): PROBNP in the last 8760 hours.  CBG: No results for input(s): GLUCAP in the last 168 hours.  Recent Results (from the past 240 hour(s))  MRSA PCR Screening     Status: None   Collection Time: 08/08/2015  2:13 AM  Result Value Ref Range Status   MRSA by PCR NEGATIVE NEGATIVE Final    Comment:        The GeneXpert MRSA Assay (FDA approved for NASAL specimens only), is one component of a comprehensive MRSA colonization surveillance program. It is not intended to diagnose MRSA infection nor to guide or monitor treatment for MRSA infections.      Studies: Ct Chest Wo Contrast  08/20/2015  CLINICAL DATA:  Shortness of breath for 2 weeks. History of pneumonia, follow-up abnormal chest radiograph. History of hypertension, hyperlipidemia, diabetes. EXAM: CT CHEST WITHOUT CONTRAST TECHNIQUE: Multidetector CT imaging of the chest was performed following the standard protocol without IV contrast. COMPARISON:  Chest radiograph August 11, 2015 FINDINGS: Mediastinum: Heart size is mildly enlarged. Moderate coronary artery calcifications. No pericardial effusion. Thoracic aorta is normal course and caliber with moderate to severe calcific atherosclerosis. Sub carinal 3.6 x 4.9 cm lymphadenopathy, confluent with RIGHT lower lobe consolidation. Precarinal 14 mm lymph node. 12 mm pretracheal lymph node. Lungs: Complete consolidation the RIGHT lower lobe associated with effaced bronchus, superimposed scattered calcifications. Small to moderate effusion with subpulmonic component. 5 mm RIGHT upper lobe ground-glass nodule. Additional scattered ground-glass and nodular consolidations scattered throughout the lungs bilaterally. No LEFT pleural effusion. No pneumothorax. Abdomen:  No acute process. Soft tissues and osseous structures:  Nonacute.  IMPRESSION: RIGHT lower lobe collapse/ consolidation associated with RIGHT lower lobe bronchus opacification. Recommend of bronchoscopy to exclude endobronchial mass causing postobstructive pneumonia. Small to moderate RIGHT pleural effusion. Additional patchy consolidations throughout the lungs compatible with multifocal pneumonia, less likely septic emboli. Lymphadenopathy (limited assessment by noncontrast CT), including 3.6 x 4.9 cm sub carinal conglomeration confluent with RIGHT lower lobe consolidation. Though these may be reactive, neoplasm is a concern. Electronically Signed   By: Elon Alas M.D.   On: 08/22/2015 00:55   Dg Chest Port 1 View  08/02/2015  CLINICAL DATA:  Cough for 3 weeks with increasing shortness of breath over the last 3 days. Ex-smoker with history of hypertension and COPD. EXAM: PORTABLE CHEST 1 VIEW COMPARISON:  09/15/2013 and 08/27/2013. FINDINGS: 1946 hours. Mild patient rotation to the right. There is volume loss in the right hemithorax with opacification inferiorly. The aerated lungs demonstrate diffuse interstitial prominence, similar to the prior study. There is a possible right subpulmonic pleural effusion. The heart size and mediastinal contours are stable. IMPRESSION: New right lower lobe airspace disease with possible subpulmonic pleural effusion, suspicious for pneumonia. Followup PA and lateral chest X-ray is recommended in 3-4 weeks following trial of antibiotic therapy to ensure resolution and exclude underlying malignancy. Electronically Signed   By: Caryl Comes.D.  On: 08/03/2015 20:11    Scheduled Meds: . aspirin  81 mg Oral Daily  . azithromycin  500 mg Intravenous Q24H  . calcium carbonate  1,250 mg Oral BID WC  . cefTRIAXone (ROCEPHIN)  IV  1 g Intravenous Q24H  . cholecalciferol  1,000 Units Oral Daily  . dextromethorphan-guaiFENesin  1 tablet Oral BID  . insulin aspart  0-9 Units Subcutaneous TID WC  . ipratropium-albuterol  3 mL  Nebulization TID  . methylPREDNISolone (SOLU-MEDROL) injection  60 mg Intravenous Q12H  . metronidazole  500 mg Intravenous Q8H  . multivitamin with minerals  1 tablet Oral Daily  . omega-3 acid ethyl esters  1 g Oral Daily  . simvastatin  10 mg Oral q1800  . vitamin C  1,000 mg Oral Daily   Continuous Infusions: . sodium chloride 100 mL/hr at 08/22/2015 0802     Time spent: 25 minutes    Madilynne Mullan  [facility] Pager [phone]. If 7PM-7AM, please contact night-coverage at [email or web address], password [facility] 08/10/2015, 8:06 AM  LOS: 1 day

## 2015-08-12 NOTE — Progress Notes (Signed)
Patient continues to refuse the use of continuous BiPAP. Order changed to prn per RT protocol.

## 2015-08-12 NOTE — Progress Notes (Signed)
  Echocardiogram 2D Echocardiogram has been performed.  Donata Clay 08/14/2015, 2:48 PM

## 2015-08-12 NOTE — Plan of Care (Signed)
Problem: Pain Managment: Goal: General experience of comfort will improve Outcome: Completed/Met Date Met:  08/01/2015 Patient has not had any complaints of pain.  Orders are available for pain control if needed.

## 2015-08-12 NOTE — Significant Event (Signed)
Troponin bumped to 9.31, Echo shows EF 35-40%. Will consult cardiology.

## 2015-08-12 NOTE — Progress Notes (Signed)
ANTIBIOTIC CONSULT NOTE - INITIAL  Pharmacy Consult for Zosyn Indication: aspiration PNA  No Known Allergies  Patient Measurements: Height: '5\' 6"'$  (167.6 cm) Weight: 133 lb 13.1 oz (60.7 kg) IBW/kg (Calculated) : 59.3  Vital Signs: Temp: 97.5 F (36.4 C) (01/19 1541) Temp Source: Oral (01/19 1541) BP: 112/58 mmHg (01/19 1800) Pulse Rate: 89 (01/19 1800) Intake/Output from previous day: 01/18 0701 - 01/19 0700 In: 1100 [I.V.:600; IV Piggyback:500] Out: -  Intake/Output from this shift:    Labs:  Recent Labs  07/26/2015 2010 08/16/2015 2319 08/18/2015 0012  WBC 28.4*  --  30.1*  HGB 7.9*  --  7.2*  PLT 802*  --  723*  LABCREA  --  101.56  --   CREATININE 2.40*  --   --    Estimated Creatinine Clearance: 19.3 mL/min (by C-G formula based on Cr of 2.4). No results for input(s): VANCOTROUGH, VANCOPEAK, VANCORANDOM, GENTTROUGH, GENTPEAK, GENTRANDOM, TOBRATROUGH, TOBRAPEAK, TOBRARND, AMIKACINPEAK, AMIKACINTROU, AMIKACIN in the last 72 hours.   Microbiology: Recent Results (from the past 720 hour(s))  Culture, blood (routine x 2)     Status: None (Preliminary result)   Collection Time: 08/10/2015  8:08 PM  Result Value Ref Range Status   Specimen Description BLOOD LEFT ANTECUBITAL  Final   Special Requests BOTTLES DRAWN AEROBIC AND ANAEROBIC 5 ML  Final   Culture   Final    NO GROWTH < 24 HOURS Performed at [facility]    Report Status PENDING  Incomplete  Culture, blood (routine x 2)     Status: None (Preliminary result)   Collection Time: 07/28/2015  8:11 PM  Result Value Ref Range Status   Specimen Description BLOOD RIGHT ANTECUBITAL  Final   Special Requests BOTTLES DRAWN AEROBIC AND ANAEROBIC 5 ML  Final   Culture   Final    NO GROWTH < 24 HOURS Performed at [facility]    Report Status PENDING  Incomplete  MRSA PCR Screening     Status: None   Collection Time: 07/25/2015  2:13 AM  Result Value Ref Range Status   MRSA by PCR NEGATIVE NEGATIVE Final     Comment:        The GeneXpert MRSA Assay (FDA approved for NASAL specimens only), is one component of a comprehensive MRSA colonization surveillance program. It is not intended to diagnose MRSA infection nor to guide or monitor treatment for MRSA infections.     Medical History: Past Medical History  Diagnosis Date  . Hypertension   . Hyperlipemia   . Carotid stenosis, bilateral   . Peripheral vascular disease ([place])   . Aortic valve stenosis 08/29/2013  . Anxiety   . GERD (gastroesophageal reflux disease)   . Diabetes mellitus without complication ([place])     type 2  . Heart murmur   . 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 aspirin  81 mg Oral Daily  . azithromycin  500 mg Intravenous Q24H  . calcium carbonate  1,250 mg Oral BID WC  . cholecalciferol  1,000 Units Oral Daily  . dextromethorphan-guaiFENesin  1 tablet Oral BID  . furosemide  20 mg Intravenous Once  . insulin aspart  0-9 Units Subcutaneous TID WC  . ipratropium-albuterol  3 mL Nebulization TID  . methylPREDNISolone (SOLU-MEDROL) injection  20 mg Intravenous 3 times per day  . metoprolol  2.5 mg Intravenous 4 times per day  . multivitamin with  minerals  1 tablet Oral Daily  . omega-3 acid ethyl esters  1 g Oral Daily  . pantoprazole  40 mg Oral Daily  . simvastatin  10 mg Oral q1800  . vitamin C  1,000 mg Oral Daily   Infusions:  . sodium chloride 10 mL/hr at 08/19/2015 1635   Assessment: 75 yo female presented to ER 1/18 with shortness of breath, productive cough with hx COPD, diastolic CHF, DM, CKD, HLD, GERD found to have possible right lower lung endobronchial obstruction with mediastinal LAN and suspected post obstr PNA vs aspiration PNA now to change Rocephin/Zithromax/Flagyl to Zosyn and Flagyl. Afeb, WBC elevated, SCr 2.4 with est CrCl of 19 ml/min at baseline  Goal of Therapy:  treatment of pneumonia  Plan:  Zosyn 2.25g IV q6 for CrCl < 20  ml/min   Adrian Saran, PharmD, BCPS Pager [phone] 07/25/2015 7:51 PM

## 2015-08-12 NOTE — Progress Notes (Signed)
PT Cancellation Note  Patient Details Name: Lindsey Weber MRN: [id number] DOB: [birth year]   Cancelled Treatment:    Reason Eval/Treat Not Completed: Patient at procedure or test/unavailable   Marcelino Freestone PT [phone]  08/01/2015, 12:41 PM

## 2015-08-12 NOTE — Progress Notes (Signed)
OT Cancellation Note  Patient Details Name: Lindsey Weber MRN: [id number] DOB: 26-Aug-1940   Cancelled Treatment:    Reason Eval/Treat Not Completed: Patient at procedure or test/ unavailable  Bodin Gorka 07/28/2015, 12:24 PM  Lesle Chris, OTR/L [phone] 08/07/2015

## 2015-08-12 NOTE — Progress Notes (Signed)
eLink Physician-Brief Progress Note Patient Name: JHANE LORIO DOB: 02-Jun-1941 MRN: [id number]   Date of Service  08/08/2015  HPI/Events of Note  Called d/t patient having an episode of increasing SOB. CXR looks like pulmonary edema. Troponin  = 9.31. Already on ASA. Given Lasix and Cardiology consulted by Hospitalist. EKG - Sinus Tachycardia and NSSTT changes on EKG c/w NSTEMI. Reluctant to heparinize patient with Hx of coughing up blood tinged sputum.   eICU Interventions  Will order: 1. Metoprolol 2.5 mg IV now and Q 6 hours. 2. BIPAP - 100%/IPAP 12/EPAP 5.  Await cardiology opinion.     Intervention Category Intermediate Interventions: Respiratory distress - evaluation and management  Prajna Vanderpool Eugene 08/03/2015, 5:23 PM

## 2015-08-12 NOTE — Progress Notes (Signed)
Family reported noticing pt had facial droop at 1800 and told RN at 39. Assessment completed by RN at [place]- no facial droop, one-sided weakness, or change in mental status noted. MD aware and has no plans to treat at this time.   Bobbye Riggs, RN

## 2015-08-12 NOTE — Consult Note (Signed)
Reason for Consult: NSTEMI   Referring Physician: Dr. Sanjuana Letters   PCP:  [name], MD  Primary Cardiologist:New  Lindsey Weber is an 75 y.o. female.    Chief Complaint: SOB on admit yesterday.   HPI: Asked to see 75 year old female for troponin of 9.31 and new EF of 35-40% in 2015 EF was 65-70% and G1DD.  Also now with moderate aortic stenosis valve area 9Vmax) 1.17, moderate MR. Now admitted with SOB.   Other hx of tobacco use, COPD, HTN, hyperlipidemia, DM, GERD, bil. Carotid artery stenosis, CKD-3-though now 5.  Her SOB and cough have been present for 2 weeks.  + prod yellow sputum.  WBC on admit was 28, lactate 1.79,   No fever.  On CXR rt lower lobe airspace disease possible PNA though may have underlying neoplasm.  EKG SR with PACs but no acute changes from 08/2013.  No prior hx of CAD.  Denies any chest pain prior to admit or now.  Her daughter stated she has always been healthy,  Initial troponin 0.17,-->0.14--> 0.11-->and today at 1400  9.31 No chest pain.   Also previous TEE in 2015 for possible mass but no mass identified just prominent lipamotous hypertrophy of the interatrial septum.  On admit yesterday her CT of chest "RIGHT lower lobe collapse/ consolidation associated with RIGHT lower lobe bronchus opacification. Recommend of bronchoscopy to exclude endobronchial mass causing postobstructive pneumonia. Small to moderate RIGHT pleural effusion.  Additional patchy consolidations throughout the lungs compatible with multifocal pneumonia, less likely septic emboli.  Lymphadenopathy (limited assessment by noncontrast CT), including 3.6 x 4.9 cm sub carinal conglomeration confluent with RIGHT lower lobe consolidation. Though these may be reactive, neoplasm is a Concern."  She has recently returned from Bronchoscopy and is severely SOB.  Her I&O is +2218  Her Lactic acid is now 3.5.  She is anemic with H/H 7.9/25.3 on admit and rec'd blood transfusion this  AM.  Her Chol LDL is 66 on zocor 10 at home.     Past Medical History  Diagnosis Date  . Hypertension   . Hyperlipemia   . Carotid stenosis, bilateral   . Peripheral vascular disease ([place])   . Aortic valve stenosis 08/29/2013  . Anxiety   . GERD (gastroesophageal reflux disease)   . Diabetes mellitus without complication ([place])     type 2  . Heart murmur   . 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 Bladder tack    . Tee without cardioversion N/A 08/28/2013    Procedure: TRANSESOPHAGEAL ECHOCARDIOGRAM (TEE);  Surgeon: Dorothy Spark, MD;  Location: [facility];  Service: Cardiovascular;  Laterality: N/A;  . Vaginal hysterectomy  1973    PARTIAL- states she had an "event with her heart" during that surgery   . Orif tibia plateau Right 09/15/2013    Procedure: OPEN REDUCTION INTERNAL FIXATION (ORIF) RIGHT TIBIAL PLATEAU AND SHAFT ;  Surgeon: Rozanna Box, MD;  Location: [facility];  Service: Orthopedics;  Laterality: Right;    Family History  Problem Relation Age of Onset  . Dementia Mother   . Heart disease Mother   . Diabetes Father    Social History:  reports that she has been smoking Cigarettes.  She has a 29 pack-year smoking history. She has never used smokeless tobacco. She reports that she does not drink alcohol or use  illicit drugs.  Allergies: No Known Allergies  OUTPATIENT MEDICATIONS: No current facility-administered medications on file prior to encounter.   Current Outpatient Prescriptions on File Prior to Encounter  Medication Sig Dispense Refill  . ALPRAZolam (XANAX) 0.5 MG tablet Take 0.5 mg by mouth as needed for anxiety.    Marland Kitchen aspirin 81 MG chewable tablet Chew 1 tablet (81 mg total) by mouth daily. 30 tablet 1  . calcium carbonate (OS-CAL) 600 MG TABS tablet Take 600 mg by mouth 2 (two) times daily with a meal.    . Cholecalciferol 1000 UNITS capsule Take 1 capsule (1,000 Units total) by mouth  daily. 30 capsule 2  . Coenzyme Q10 (CO Q 10) 100 MG CAPS Take 1 capsule by mouth daily.    . Multiple Vitamins-Minerals (MULTIVITAMIN WITH MINERALS) tablet Take 1 tablet by mouth daily.    Marland Kitchen omega-3 acid ethyl esters (LOVAZA) 1 G capsule Take 1 g by mouth daily.    . simvastatin (ZOCOR) 10 MG tablet Take 10 mg by mouth daily.    Marland Kitchen amLODipine (NORVASC) 5 MG tablet Take 1 tablet (5 mg total) by mouth daily. (Patient not taking: Reported on 08/18/2015) 30 tablet 1  . glipiZIDE (GLUCOTROL) 5 MG tablet Take 0.5 tablets (2.5 mg total) by mouth daily before breakfast. (Patient not taking: Reported on 08/16/2015) 30 tablet 0   CURRENT MEDICATIONS: Scheduled Meds: . aspirin  81 mg Oral Daily  . azithromycin  500 mg Intravenous Q24H  . calcium carbonate  1,250 mg Oral BID WC  . cefTRIAXone (ROCEPHIN)  IV  1 g Intravenous Q24H  . cholecalciferol  1,000 Units Oral Daily  . dextromethorphan-guaiFENesin  1 tablet Oral BID  . furosemide  40 mg Intravenous Once  . insulin aspart  0-9 Units Subcutaneous TID WC  . ipratropium-albuterol  3 mL Nebulization TID  . methylPREDNISolone (SOLU-MEDROL) injection  20 mg Intravenous 3 times per day  . metronidazole  500 mg Intravenous Q8H  . multivitamin with minerals  1 tablet Oral Daily  . omega-3 acid ethyl esters  1 g Oral Daily  . pantoprazole  40 mg Oral Daily  . simvastatin  10 mg Oral q1800  . vitamin C  1,000 mg Oral Daily   Continuous Infusions: . sodium chloride 10 mL/hr at 08/14/2015 1635   PRN Meds:.acetaminophen, albuterol, ALPRAZolam, morphine injection, nitroGLYCERIN   Results for orders placed or performed during the hospital encounter of 08/23/2015 (from the past 48 hour(s))  Culture, blood (routine x 2)     Status: None (Preliminary result)   Collection Time: 08/14/2015  8:08 PM  Result Value Ref Range   Specimen Description BLOOD LEFT ANTECUBITAL    Special Requests BOTTLES DRAWN AEROBIC AND ANAEROBIC 5 ML    Culture      NO GROWTH < 24  HOURS Performed at [facility]    Report Status PENDING   Comprehensive metabolic panel     Status: Abnormal   Collection Time: 08/04/2015  8:10 PM  Result Value Ref Range   Sodium 138 135 - 145 mmol/L   Potassium 4.5 3.5 - 5.1 mmol/L   Chloride 104 101 - 111 mmol/L   CO2 20 (L) 22 - 32 mmol/L   Glucose, Bld 153 (H) 65 - 99 mg/dL   BUN 57 (H) 6 - 20 mg/dL   Creatinine, Ser 2.40 (H) 0.44 - 1.00 mg/dL   Calcium 9.2 8.9 - 10.3 mg/dL   Total Protein 7.4 6.5 - 8.1 g/dL  Albumin 2.2 (L) 3.5 - 5.0 g/dL   AST 79 (H) 15 - 41 U/L   ALT 92 (H) 14 - 54 U/L   Alkaline Phosphatase 386 (H) 38 - 126 U/L   Total Bilirubin 0.8 0.3 - 1.2 mg/dL   GFR calc non Af Amer 19 (L) >60 mL/min   GFR calc Af Amer 22 (L) >60 mL/min    Comment: (NOTE) The eGFR has been calculated using the CKD EPI equation. This calculation has not been validated in all clinical situations. eGFR's persistently <60 mL/min signify possible Chronic Kidney Disease.    Anion gap 14 5 - 15  Brain natriuretic peptide     Status: Abnormal   Collection Time: 07/30/2015  8:10 PM  Result Value Ref Range   B Natriuretic Peptide 1918.0 (H) 0.0 - 100.0 pg/mL  Troponin I     Status: Abnormal   Collection Time: 08/07/2015  8:10 PM  Result Value Ref Range   Troponin I 0.17 (H) <0.031 ng/mL    Comment:        PERSISTENTLY INCREASED TROPONIN VALUES IN THE RANGE OF 0.04-0.49 ng/mL CAN BE SEEN IN:       -UNSTABLE ANGINA       -CONGESTIVE HEART FAILURE       -MYOCARDITIS       -CHEST TRAUMA       -ARRYHTHMIAS       -LATE PRESENTING MYOCARDIAL INFARCTION       -COPD   CLINICAL FOLLOW-UP RECOMMENDED.   CBC with Differential     Status: Abnormal   Collection Time: 08/10/2015  8:10 PM  Result Value Ref Range   WBC 28.4 (H) 4.0 - 10.5 K/uL   RBC 3.22 (L) 3.87 - 5.11 MIL/uL   Hemoglobin 7.9 (L) 12.0 - 15.0 g/dL   HCT 25.3 (L) 36.0 - 46.0 %   MCV 78.6 78.0 - 100.0 fL   MCH 24.5 (L) 26.0 - 34.0 pg   MCHC 31.2 30.0 - 36.0 g/dL   RDW  15.1 11.5 - 15.5 %   Platelets 802 (H) 150 - 400 K/uL   Neutrophils Relative % 93 %   Lymphocytes Relative 5 %   Monocytes Relative 2 %   Eosinophils Relative 0 %   Basophils Relative 0 %   Neutro Abs 26.4 (H) 1.7 - 7.7 K/uL   Lymphs Abs 1.4 0.7 - 4.0 K/uL   Monocytes Absolute 0.6 0.1 - 1.0 K/uL   Eosinophils Absolute 0.0 0.0 - 0.7 K/uL   Basophils Absolute 0.0 0.0 - 0.1 K/uL   WBC Morphology WHITE COUNT CONFIRMED ON SMEAR    Smear Review PLATELET COUNT CONFIRMED BY SMEAR     Comment: MORPHOLOGY UNREMARKABLE  Culture, blood (routine x 2)     Status: None (Preliminary result)   Collection Time: 07/28/2015  8:11 PM  Result Value Ref Range   Specimen Description BLOOD RIGHT ANTECUBITAL    Special Requests BOTTLES DRAWN AEROBIC AND ANAEROBIC 5 ML    Culture      NO GROWTH < 24 HOURS Performed at [facility]    Report Status PENDING   I-Stat CG4 Lactic Acid, ED     Status: None   Collection Time: 08/17/2015  8:18 PM  Result Value Ref Range   Lactic Acid, Venous 1.79 0.5 - 2.0 mmol/L  Creatinine, urine, random     Status: None   Collection Time: 08/07/2015 11:19 PM  Result Value Ref Range   Creatinine, Urine 101.56 mg/dL  Comment: Performed at [facility]  Sodium, urine, random     Status: None   Collection Time: 08/06/2015 11:19 PM  Result Value Ref Range   Sodium, Ur 13 mmol/L    Comment: Performed at [facility]  Strep pneumoniae urinary antigen     Status: None   Collection Time: 07/25/2015 11:19 PM  Result Value Ref Range   Strep Pneumo Urinary Antigen NEGATIVE NEGATIVE    Comment:        Infection due to S. pneumoniae cannot be absolutely ruled out since the antigen present may be below the detection limit of the test. Performed at [facility]   Blood gas, arterial     Status: Abnormal   Collection Time: 08/16/2015 11:40 PM  Result Value Ref Range   O2 Content 4.0 L/min   Delivery systems NASAL CANNULA    pH, Arterial 7.424 7.350 - 7.450    pCO2 arterial 27.0 (L) 35.0 - 45.0 mmHg   pO2, Arterial 79.9 (L) 80.0 - 100.0 mmHg   Bicarbonate 17.5 (L) 20.0 - 24.0 mEq/L   TCO2 16.8 0 - 100 mmol/L   Acid-base deficit 6.0 (H) 0.0 - 2.0 mmol/L   O2 Saturation 96.0 %   Patient temperature 97.6    Collection site RIGHT RADIAL    Drawn by [id number]    Sample type ARTERIAL    Allens test (pass/fail) PASS PASS  Vitamin B12     Status: Abnormal   Collection Time: 08/17/2015 12:12 AM  Result Value Ref Range   Vitamin B-12 1455 (H) 180 - 914 pg/mL    Comment: (NOTE) This assay is not validated for testing neonatal or myeloproliferative syndrome specimens for Vitamin B12 levels. Performed at [facility]   Iron and TIBC     Status: Abnormal   Collection Time: 08/07/2015 12:12 AM  Result Value Ref Range   Iron 16 (L) 28 - 170 ug/dL   TIBC 193 (L) 250 - 450 ug/dL   Saturation Ratios 8 (L) 10.4 - 31.8 %   UIBC 177 ug/dL    Comment: Performed at [facility]  Ferritin     Status: Abnormal   Collection Time: 08/22/2015 12:12 AM  Result Value Ref Range   Ferritin 762 (H) 11 - 307 ng/mL    Comment: Performed at [facility]  Reticulocytes     Status: Abnormal   Collection Time: 08/24/2015 12:12 AM  Result Value Ref Range   Retic Ct Pct 3.1 0.4 - 3.1 %   RBC. 2.90 (L) 3.87 - 5.11 MIL/uL   Retic Count, Manual 89.9 19.0 - 186.0 K/uL  Troponin I (q 6hr x 3)     Status: Abnormal   Collection Time: 07/26/2015 12:12 AM  Result Value Ref Range   Troponin I 0.14 (H) <0.031 ng/mL    Comment:        PERSISTENTLY INCREASED TROPONIN VALUES IN THE RANGE OF 0.04-0.49 ng/mL CAN BE SEEN IN:       -UNSTABLE ANGINA       -CONGESTIVE HEART FAILURE       -MYOCARDITIS       -CHEST TRAUMA       -ARRYHTHMIAS       -LATE PRESENTING MYOCARDIAL INFARCTION       -COPD   CLINICAL FOLLOW-UP RECOMMENDED.   Lactic acid, plasma     Status: Abnormal   Collection Time: 08/21/2015 12:12 AM  Result Value Ref Range   Lactic Acid, Venous 3.5 (  HH) 0.5  - 2.0 mmol/L    Comment: CRITICAL RESULT CALLED TO, READ BACK BY AND VERIFIED WITH: K ALMON [id number] 07/25/2015 A NAVARRO   Procalcitonin     Status: None   Collection Time: 07/30/2015 12:12 AM  Result Value Ref Range   Procalcitonin 0.95 ng/mL    Comment:        Interpretation: PCT > 0.5 ng/mL and <= 2 ng/mL: Systemic infection (sepsis) is possible, but other conditions are known to elevate PCT as well. (NOTE)         ICU PCT Algorithm               Non ICU PCT Algorithm    ----------------------------     ------------------------------         PCT < 0.25 ng/mL                 PCT < 0.1 ng/mL     Stopping of antibiotics            Stopping of antibiotics       strongly encouraged.               strongly encouraged.    ----------------------------     ------------------------------       PCT level decrease by               PCT < 0.25 ng/mL       >= 80% from peak PCT       OR PCT 0.25 - 0.5 ng/mL          Stopping of antibiotics                                             encouraged.     Stopping of antibiotics           encouraged.    ----------------------------     ------------------------------       PCT level decrease by              PCT >= 0.25 ng/mL       < 80% from peak PCT        AND PCT >= 0.5 ng/mL             Continuing antibiotics                                              encouraged.       Continuing antibiotics            encouraged.    ----------------------------     ------------------------------     PCT level increase compared          PCT > 0.5 ng/mL         with peak PCT AND          PCT >= 0.5 ng/mL             Escalation of antibiotics                                          strongly encouraged.      Escalation of antibiotics  strongly encouraged.   Protime-INR     Status: None   Collection Time: 08/06/2015 12:12 AM  Result Value Ref Range   Prothrombin Time 14.4 11.6 - 15.2 seconds   INR 1.10 0.00 - 1.49  APTT     Status: Abnormal   Collection  Time: 08/11/2015 12:12 AM  Result Value Ref Range   aPTT 21 (L) 24 - 37 seconds  CBC     Status: Abnormal   Collection Time: 07/25/2015 12:12 AM  Result Value Ref Range   WBC 30.1 (H) 4.0 - 10.5 K/uL   RBC 2.93 (L) 3.87 - 5.11 MIL/uL   Hemoglobin 7.2 (L) 12.0 - 15.0 g/dL   HCT 23.2 (L) 36.0 - 46.0 %   MCV 79.2 78.0 - 100.0 fL   MCH 24.6 (L) 26.0 - 34.0 pg   MCHC 31.0 30.0 - 36.0 g/dL   RDW 15.1 11.5 - 15.5 %   Platelets 723 (H) 150 - 400 K/uL  Influenza panel by PCR (type A & B, H1N1)     Status: None   Collection Time: 08/23/2015  1:51 AM  Result Value Ref Range   Influenza A By PCR NEGATIVE NEGATIVE   Influenza B By PCR NEGATIVE NEGATIVE   H1N1 flu by pcr NOT DETECTED NOT DETECTED    Comment:        The Xpert Flu assay (FDA approved for nasal aspirates or washes and nasopharyngeal swab specimens), is intended as an aid in the diagnosis of influenza and should not be used as a sole basis for treatment. Performed at [facility]   MRSA PCR Screening     Status: None   Collection Time: 08/11/2015  2:13 AM  Result Value Ref Range   MRSA by PCR NEGATIVE NEGATIVE    Comment:        The GeneXpert MRSA Assay (FDA approved for NASAL specimens only), is one component of a comprehensive MRSA colonization surveillance program. It is not intended to diagnose MRSA infection nor to guide or monitor treatment for MRSA infections.   Lipid panel     Status: Abnormal   Collection Time: 08/04/2015  4:51 AM  Result Value Ref Range   Cholesterol 138 0 - 200 mg/dL   Triglycerides 214 (H) <150 mg/dL   HDL 29 (L) >40 mg/dL   Total CHOL/HDL Ratio 4.8 RATIO   VLDL 43 (H) 0 - 40 mg/dL   LDL Cholesterol 66 0 - 99 mg/dL    Comment:        Total Cholesterol/HDL:CHD Risk Coronary Heart Disease Risk Table                     Men   Women  1/2 Average Risk   3.4   3.3  Average Risk       5.0   4.4  2 X Average Risk   9.6   7.1  3 X Average Risk  23.4   11.0        Use the calculated  Patient Ratio above and the CHD Risk Table to determine the patient's CHD Risk.        ATP III CLASSIFICATION (LDL):  <100     mg/dL   Optimal  100-129  mg/dL   Near or Above                    Optimal  130-159  mg/dL   Borderline  160-189  mg/dL   High  >  190     mg/dL   Very High Performed at [facility]   Lactic acid, plasma     Status: None   Collection Time: 08/23/2015  4:51 AM  Result Value Ref Range   Lactic Acid, Venous 2.0 0.5 - 2.0 mmol/L  Folate     Status: None   Collection Time: 07/31/2015  5:00 AM  Result Value Ref Range   Folate 15.9 >5.9 ng/mL    Comment: Performed at [facility]  Troponin I (q 6hr x 3)     Status: Abnormal   Collection Time: 08/07/2015  7:20 AM  Result Value Ref Range   Troponin I 0.11 (H) <0.031 ng/mL    Comment:        PERSISTENTLY INCREASED TROPONIN VALUES IN THE RANGE OF 0.04-0.49 ng/mL CAN BE SEEN IN:       -UNSTABLE ANGINA       -CONGESTIVE HEART FAILURE       -MYOCARDITIS       -CHEST TRAUMA       -ARRYHTHMIAS       -LATE PRESENTING MYOCARDIAL INFARCTION       -COPD   CLINICAL FOLLOW-UP RECOMMENDED.   Glucose, capillary     Status: Abnormal   Collection Time: 08/08/2015  7:55 AM  Result Value Ref Range   Glucose-Capillary 252 (H) 65 - 99 mg/dL  Prepare RBC     Status: None   Collection Time: 08/17/2015  8:30 AM  Result Value Ref Range   Order Confirmation ORDER PROCESSED BY BLOOD BANK   TSH     Status: None   Collection Time: 07/28/2015  9:20 AM  Result Value Ref Range   TSH 1.910 0.350 - 4.500 uIU/mL  Type and screen [facility]     Status: None (Preliminary result)   Collection Time: 07/25/2015  9:20 AM  Result Value Ref Range   ABO/RH(D) O POS    Antibody Screen NEG    Sample Expiration 08/15/2015    Unit Number [id number]    Blood Component Type RED CELLS,LR    Unit division 00    Status of Unit ISSUED    Transfusion Status OK TO TRANSFUSE    Crossmatch Result Compatible    Unit Number  [id number]    Blood Component Type RED CELLS,LR    Unit division 00    Status of Unit ALLOCATED    Transfusion Status OK TO TRANSFUSE    Crossmatch Result Compatible   Troponin I     Status: Abnormal   Collection Time: 08/23/2015  2:32 PM  Result Value Ref Range   Troponin I 9.31 (HH) <0.031 ng/mL    Comment:        POSSIBLE MYOCARDIAL ISCHEMIA. SERIAL TESTING RECOMMENDED. CRITICAL RESULT CALLED TO, READ BACK BY AND VERIFIED WITH: B.MCNABB AT 1520 ON 07/27/2015 BY S.VANHOORNE   Glucose, capillary     Status: Abnormal   Collection Time: 08/19/2015  3:40 PM  Result Value Ref Range   Glucose-Capillary 209 (H) 65 - 99 mg/dL   Ct Chest Wo Contrast  08/08/2015  CLINICAL DATA:  Shortness of breath for 2 weeks. History of pneumonia, follow-up abnormal chest radiograph. History of hypertension, hyperlipidemia, diabetes. EXAM: CT CHEST WITHOUT CONTRAST TECHNIQUE: Multidetector CT imaging of the chest was performed following the standard protocol without IV contrast. COMPARISON:  Chest radiograph August 11, 2015 FINDINGS: Mediastinum: Heart size is mildly enlarged. Moderate coronary artery calcifications. No pericardial effusion. Thoracic aorta is normal  course and caliber with moderate to severe calcific atherosclerosis. Sub carinal 3.6 x 4.9 cm lymphadenopathy, confluent with RIGHT lower lobe consolidation. Precarinal 14 mm lymph node. 12 mm pretracheal lymph node. Lungs: Complete consolidation the RIGHT lower lobe associated with effaced bronchus, superimposed scattered calcifications. Small to moderate effusion with subpulmonic component. 5 mm RIGHT upper lobe ground-glass nodule. Additional scattered ground-glass and nodular consolidations scattered throughout the lungs bilaterally. No LEFT pleural effusion. No pneumothorax. Abdomen:  No acute process. Soft tissues and osseous structures:  Nonacute. IMPRESSION: RIGHT lower lobe collapse/ consolidation associated with RIGHT lower lobe bronchus  opacification. Recommend of bronchoscopy to exclude endobronchial mass causing postobstructive pneumonia. Small to moderate RIGHT pleural effusion. Additional patchy consolidations throughout the lungs compatible with multifocal pneumonia, less likely septic emboli. Lymphadenopathy (limited assessment by noncontrast CT), including 3.6 x 4.9 cm sub carinal conglomeration confluent with RIGHT lower lobe consolidation. Though these may be reactive, neoplasm is a concern. Electronically Signed   By: Elon Alas M.D.   On: 08/09/2015 00:55   Dg Chest Port 1 View  08/18/2015  CLINICAL DATA:  Status post bronchoscopy with biopsy. EXAM: PORTABLE CHEST 1 VIEW COMPARISON:  Chest CT earlier today.  Chest radiograph 08/16/2015. FINDINGS: The patient is rotated to the right. The cardiomediastinal silhouette is grossly unchanged. Right lung volume loss is unchanged. Opacification of the right lower hemithorax is unchanged and consistent with underlying right lower lobe collapse/ consolidation and small to moderate-sized right pleural effusion as seen on CT. Additional patchy and nodular opacities elsewhere in both lungs are better seen on CT. There is mild diffuse interstitial prominence which is similar to the prior radiograph. No pneumothorax is identified. IMPRESSION: Unchanged right lower lobe collapse/consolidation and pleural effusion. No pneumothorax identified. Electronically Signed   By: Logan Bores M.D.   On: 08/11/2015 13:17   Dg Chest Port 1 View  08/15/2015  CLINICAL DATA:  Cough for 3 weeks with increasing shortness of breath over the last 3 days. Ex-smoker with history of hypertension and COPD. EXAM: PORTABLE CHEST 1 VIEW COMPARISON:  09/15/2013 and 08/27/2013. FINDINGS: 1946 hours. Mild patient rotation to the right. There is volume loss in the right hemithorax with opacification inferiorly. The aerated lungs demonstrate diffuse interstitial prominence, similar to the prior study. There is a possible  right subpulmonic pleural effusion. The heart size and mediastinal contours are stable. IMPRESSION: New right lower lobe airspace disease with possible subpulmonic pleural effusion, suspicious for pneumonia. Followup PA and lateral chest X-ray is recommended in 3-4 weeks following trial of antibiotic therapy to ensure resolution and exclude underlying malignancy. Electronically Signed   By: Richardean Sale M.D.   On: 08/24/2015 20:11   ECHO:  Study Conclusions  - Left ventricle: The cavity size was normal. There was mild focal basal hypertrophy of the septum. Systolic function was moderately reduced. The estimated ejection fraction was in the range of 35% to 40%. Diffuse hypokinesis. There is akinesis of the mid-apicalinferior myocardium. There is severe hypokinesis of the anteroseptal myocardium. Doppler parameters are consistent with abnormal left ventricular relaxation (grade 1 diastolic dysfunction). Doppler parameters are consistent with high ventricular filling pressure. - Aortic valve: There was moderate stenosis. Valve area (VTI): 1.12 cm^2. Valve area (Vmax): 1.17 cm^2. Valve area (Vmean): 1.11 cm^2. - Mitral valve: Calcified annulus. There was moderate regurgitation. - Left atrium: The atrium was moderately dilated.  Impressions:  - Anteroseptal hypokinesis; distal inferior akinesis; overall moderately reduced LV function; grade 1 diastolic dysfunction; elevated LV filling pressure;  aortic valve not well visualized but appears calcified with moderate AS by continuity equation; moderate LAE; moderate MR.  ROS: General:no colds or fevers, + weight changes down 20 lbs from  Skin:no rashes or ulcers HEENT:no blurred vision, no congestion CV:see HPI PUL:see HPI GI:no diarrhea constipation or melena, no indigestion GU:no hematuria, no dysuria MS:no joint pain, no claudication Neuro:no syncope, no lightheadedness Endo:+ diabetes, no thyroid  disease   Blood pressure 121/46, pulse 92, temperature 97.5 F (36.4 C), temperature source Oral, resp. rate 21, height '5\' 6"'$  (1.676 m), weight 133 lb 13.1 oz (60.7 kg), SpO2 93 %.  Wt Readings from Last 3 Encounters:  08/20/2015 133 lb 13.1 oz (60.7 kg)  10/28/14 155 lb (70.308 kg)  09/15/13 150 lb 2 oz (68.096 kg)    PE: General:Pleasant affect, respiratory distress  Skin:Warm and dry, brisk capillary refill HEENT:normocephalic, sclera clear, mucus membranes moist Neck:supple, no JVD, no bruits  Heart:very muffled due to breath sounds S1S2 RRR  Lungs:without rales, diffuse rhonchi, and wheezes ZOX:WRUE, non tender, + BS, do not palpate liver spleen or masses Ext:no lower ext edema, 2+ pedal pulses, 2+ radial pulses Neuro:alert and oriented X 3, MAE, follows commands, + facial symmetry    Assessment/Plan Principal Problem:   Acute on chronic respiratory failure with hypoxia (HCC) Active Problems:   Peripheral vascular disease (HCC)   Carotid stenosis, bilateral   COPD exacerbation ([place])   Hyperlipemia   Hypertension   Diabetes mellitus without complication ([place])   Aortic valve stenosis   Acute renal failure superimposed on stage 3 chronic kidney disease ([place])   CAP (community acquired pneumonia)   Sepsis ([place])   NSTEMI (non-ST elevated myocardial infarction) ([place])   Chronic diastolic (congestive) heart failure (HCC)   Abnormal liver function   Microcytic anemia   GERD (gastroesophageal reflux disease)   Endobronchial mass   Elevated Troponin-NSTEMI  may be demand ischemia- no EKG changes but concern with new cardiomyopathy -with new acute renal failure concern for cath though we may need to proceed.  Pt is a DNR -  EKG this pm with some mild ST elevation III  PNA being treated  Probable lung neoplasm -just back from Bronch.  + wt loss, + tobacco use stopped 2-3 weeks ago  New Cardiomyopathy - EF 35-40% with possible acute systolic HF with + > 2L- agree with IM and IV  lasix.  CXR just completed--Increased bilateral interstitial densities are noted concerning for edema or possibly pneumonia  Anemia with transfusion. With anemia would be concerned for Heparin Iron, 16;  TIBC 193; sat ratio 8;  UIBC 177  Acute renal failure on chronic kidney disease.       [place]  Nurse Practitioner Certified [place] Pager [phone] or after 5pm or weekends call [phone] 08/03/2015, 4:07 PM    History and all data above reviewed.  Patient examined.  I agree with the findings as above.   The patient presents with acute on chronic SOB.  Being treated for acute possible sepsis with post obstructive pneumonia with a possible neoplasm.  She has chronic anemia and acute on chronic renal insufficiency.  She does not have chest pain.  She currently has acute dyspnea status post bronchoscopy.  She has had no past cardiac history and no chest pain at home.  She is frail.  She was noted to have a reduced EF on echo and now has an elevation in her troponin as above. The patient exam reveals COR:RRR  ,  Lungs: Decreased breath sounds  ,  Abd: Positive bowel sounds, no rebound no guarding, Ext No edema   .  All available labs, radiology testing, previous records reviewed. Agree with documented assessment and plan.   NSTEMI:  Recent event likely related to acute pulmonary process.  Subtle ST changes on inferior leads.  However, she is not a candidate for invasive study.  She needs conservative management.   I would continue ASA and suggest heparin if she has no contraindication.     Lindsey Weber  5:48 PM  08/01/2015

## 2015-08-12 NOTE — Plan of Care (Signed)
Problem: Safety: Goal: Ability to remain free from injury will improve Outcome: Progressing Patient assessed for risk factors.  Pt's bed is in the lowest position.  Bed alarm is on.  Call bell is within reach of patient.

## 2015-08-12 NOTE — Consult Note (Signed)
PCCM PROGRESS NOTE  ADMISSION DATE: 07/30/2015 CONSULT DATE: 08/10/2015 REFERRING PROVIDER: Dr. Sanjuana Letters  CC: Short of breath  HISTORY OF PRESENT ILLNESS: 75 yo female smoker brought to ER with 2 weeks of dyspnea, productive cough, and decreased appetite.  She also c/o pleuritic type chest pain on Rt, rhinorrhea, and mild diarrhea.  Noted to have wheezing in ER, and was hypoxic.  She also had elevated lactic acid.  CXR showed new RLL ASD and effusion.  She was started on Abx of community acquired PNA.  CT chest showed RLL collapse, subcarinal adenopathy, and possible endobronchial obstruction.  She has also lost about 25 lbs.  She denies hemoptysis, fever, headache, dysphagia, change in vision, or gland swelling.  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  PAST SURGICAL HISTORY: She  has past surgical history that includes BLADDER TACK; TEE without cardioversion (N/A, 08/28/2013); Vaginal hysterectomy (1973); and ORIF tibia plateau (Right, 09/15/2013).  FAMILY HISTORY: Her family history includes Dementia in her mother; Diabetes in her father; Heart disease in her mother.  SOCIAL HISTORY: She  reports that she has been smoking Cigarettes.  She has a 29 pack-year smoking history. She has never used smokeless tobacco. She reports that she does not drink alcohol or use illicit drugs.  No Known Allergies  No current facility-administered medications on file prior to encounter.   Current Outpatient Prescriptions on File Prior to Encounter  Medication Sig  . ALPRAZolam (XANAX) 0.5 MG tablet Take 0.5 mg by mouth as needed for anxiety.  Marland Kitchen aspirin 81 MG chewable  tablet Chew 1 tablet (81 mg total) by mouth daily.  . calcium carbonate (OS-CAL) 600 MG TABS tablet Take 600 mg by mouth 2 (two) times daily with a meal.  . Cholecalciferol 1000 UNITS capsule Take 1 capsule (1,000 Units total) by mouth daily.  . Coenzyme Q10 (CO Q 10) 100 MG CAPS Take 1 capsule by mouth daily.  . Multiple Vitamins-Minerals (MULTIVITAMIN WITH MINERALS) tablet Take 1 tablet by mouth daily.  Marland Kitchen omega-3 acid ethyl esters (LOVAZA) 1 G capsule Take 1 g by mouth daily.  . simvastatin (ZOCOR) 10 MG tablet Take 10 mg by mouth daily.  Marland Kitchen amLODipine (NORVASC) 5 MG tablet Take 1 tablet (5 mg total) by mouth daily. (Patient not taking: Reported on 08/15/2015)  . glipiZIDE (GLUCOTROL) 5 MG tablet Take 0.5 tablets (2.5 mg total) by mouth daily before breakfast. (Patient not taking: Reported on 07/30/2015)    REVIEW OF SYSTEMS: Negative except above.  SUBJECTIVE: Still has cough.  VITAL SIGNS: BP 124/38 mmHg  Pulse 87  Temp(Src) 97.2 F (36.2 C) (Oral)  Resp 23  Ht '5\' 6"'$  (1.676 m)  Wt 133 lb 13.1 oz (60.7 kg)  BMI 21.61 kg/m2  SpO2 98%  INTAKE/OUTPUT: I/O last 3 completed shifts: In: 1000 [I.V.:500; IV Piggyback:500] Out: -   General: alert HEENT: wears glasses, pupils reactive, MP 2, no oral exudate, no LAN Cardiac: regular, 2/6 SM Chest: decreased BS Rt base, no wheeze Abd: soft, non tender, +bowel sounds Ext: no edema, cyanosis, clubbing Neuro: alert, normal strength, CN intact Skin: no rashes   CMP Latest Ref Rng 08/18/2015 09/17/2013 09/16/2013  Glucose 65 - 99 mg/dL 153(H) 107(H) 99  BUN 6 - 20 mg/dL  57(H) 26(H) 28(H)  Creatinine 0.44 - 1.00 mg/dL 2.40(H) 1.59(H) 1.60(H)  Sodium 135 - 145 mmol/L 138 144 142  Potassium 3.5 - 5.1 mmol/L 4.5 5.3 5.4(H)  Chloride 101 - 111 mmol/L 104 105 103  CO2 22 - 32 mmol/L 20(L) 26 25  Calcium 8.9 - 10.3 mg/dL 9.2 9.3 9.0  Total Protein 6.5 - 8.1 g/dL 7.4 - -  Total Bilirubin 0.3 - 1.2 mg/dL 0.8 - -  Alkaline Phos 38 - 126 U/L  386(H) - -  AST 15 - 41 U/L 79(H) - -  ALT 14 - 54 U/L 92(H) - -     CBC Latest Ref Rng 08/02/2015 08/18/2015 09/17/2013  WBC 4.0 - 10.5 K/uL 30.1(H) 28.4(H) 10.2  Hemoglobin 12.0 - 15.0 g/dL 7.2(L) 7.9(L) 9.8(L)  Hematocrit 36.0 - 46.0 % 23.2(L) 25.3(L) 29.6(L)  Platelets 150 - 400 K/uL 723(H) 802(H) 462(H)     Ct Chest Wo Contrast  08/10/2015  CLINICAL DATA:  Shortness of breath for 2 weeks. History of pneumonia, follow-up abnormal chest radiograph. History of hypertension, hyperlipidemia, diabetes. EXAM: CT CHEST WITHOUT CONTRAST TECHNIQUE: Multidetector CT imaging of the chest was performed following the standard protocol without IV contrast. COMPARISON:  Chest radiograph August 11, 2015 FINDINGS: Mediastinum: Heart size is mildly enlarged. Moderate coronary artery calcifications. No pericardial effusion. Thoracic aorta is normal course and caliber with moderate to severe calcific atherosclerosis. Sub carinal 3.6 x 4.9 cm lymphadenopathy, confluent with RIGHT lower lobe consolidation. Precarinal 14 mm lymph node. 12 mm pretracheal lymph node. Lungs: Complete consolidation the RIGHT lower lobe associated with effaced bronchus, superimposed scattered calcifications. Small to moderate effusion with subpulmonic component. 5 mm RIGHT upper lobe ground-glass nodule. Additional scattered ground-glass and nodular consolidations scattered throughout the lungs bilaterally. No LEFT pleural effusion. No pneumothorax. Abdomen:  No acute process. Soft tissues and osseous structures:  Nonacute. IMPRESSION: RIGHT lower lobe collapse/ consolidation associated with RIGHT lower lobe bronchus opacification. Recommend of bronchoscopy to exclude endobronchial mass causing postobstructive pneumonia. Small to moderate RIGHT pleural effusion. Additional patchy consolidations throughout the lungs compatible with multifocal pneumonia, less likely septic emboli. Lymphadenopathy (limited assessment by noncontrast CT), including  3.6 x 4.9 cm sub carinal conglomeration confluent with RIGHT lower lobe consolidation. Though these may be reactive, neoplasm is a concern. Electronically Signed   By: Elon Alas M.D.   On: 08/03/2015 00:55   Dg Chest Port 1 View  07/26/2015  CLINICAL DATA:  Cough for 3 weeks with increasing shortness of breath over the last 3 days. Ex-smoker with history of hypertension and COPD. EXAM: PORTABLE CHEST 1 VIEW COMPARISON:  09/15/2013 and 08/27/2013. FINDINGS: 1946 hours. Mild patient rotation to the right. There is volume loss in the right hemithorax with opacification inferiorly. The aerated lungs demonstrate diffuse interstitial prominence, similar to the prior study. There is a possible right subpulmonic pleural effusion. The heart size and mediastinal contours are stable. IMPRESSION: New right lower lobe airspace disease with possible subpulmonic pleural effusion, suspicious for pneumonia. Followup PA and lateral chest X-ray is recommended in 3-4 weeks following trial of antibiotic therapy to ensure resolution and exclude underlying malignancy. Electronically Signed   By: Richardean Sale M.D.   On: 08/20/2015 20:11     CULTURES: 1/18 Legionella Ag >>  1/18 Pneumococcal Ag >> negative 1/18 Blood >> 1/18 Sputum >> 1/19 Influenza PCR >> negative  ANTIBIOTICS: 1/18 Rocephin >> 1/18 Zithromax >> 1/19 Flagyl >>  LINES/TUBES:  STUDIES: 1/18 CT chest >> RLL consolidation, small Rt  effusion, ?obstruction Rt lower lobe bronchus intermedius, 4.9 cm subcarinal LAN, 1.4 cm precarinal LAN, 1.2 cm pretracheal LAN, 5 mm RUL nodule 1/19 Echo >>  EVENTS: 1/18 Admit  DISCUSSION: 75 yo female smoker with productive cough, weight loss, dyspnea, Rt pleuritic chest pain from acute hypoxic respiratory failure 2nd to post-obstructive pneumonia with possible RLL endobronchial lesion and mediastinal lymphadenopathy.  She has hx of PAD, Aortic stenosis, HTN, diastolic CHF, HLD, DM, CKD stage 3, GERD,  Anxiety  ASSESSMENT/PLAN:  Possible Rt lower lung endobronchial obstruction with mediastinal LAN. Plan: - she will need bronchoscopy >> procedure explained.  Alternatives discussed.  Risks detailed as bleeding, infection, pneumothorax, and non diagnosis.  She is agreeable to proceed  Acute hypoxic respiratory failure. Plan: - oxygen to keep SpO2 > 92%  Sepsis form post-obstructive pneumonia. Lactic acidosis >> resolved. Plan: - Day 2 of abx, currently on rocephin/zithromax/flagyl - continue IV fluid  Tobacco abuse >> pt denies hx of COPD. Plan: - continue scheduled BDs for now - change solumedrol to 20 mg q8h  Mildly elevated troponin >> likely demand ischemia. Plan: - f/u Echo  Anemia. Plan: - per primary team  CKD stage 3. Plan: - monitor renal fx, urine outpt  Goals of Care >> DNR/DNI.  D/w Dr. Sanjuana Letters.  Chesley Mires, MD [facility] Pulmonary/Critical Care 07/30/2015, 8:18 AM Pager:  [phone] After 3pm call: [phone]

## 2015-08-12 NOTE — ED Notes (Signed)
Patient transported to CT 

## 2015-08-12 NOTE — Progress Notes (Addendum)
Noted FOB findings of endobronchial lesion, shared results with family(daughter, daughter in law and 2 sons) who then went on to state that patient had hemoptysis in December 2016, and that she has been sick for a while she just didn't want to get evaluated. I discussed the need for anticoagulation in view of NSTEMI, and risk of bleeding. Also discussed with Dr.Sommer on call for pulmonary. Family deferred to the medical team to decide on way forward as far as anticoagulation is concerned. Patient has a very high risk for bleeding, given hemoptysis, anemia, therefore will hold off anticoagulation at this point and continue other interventions. Her family states that patient is DNR, which we will respect. Will change antibiotics to Zosyn/Zithromax as possible obstructive process likely playing a significant role. Will follow pulmonary/cardiology recommendations.

## 2015-08-12 NOTE — Plan of Care (Signed)
Problem: Phase I Progression Outcomes Goal: Pain controlled Outcome: Completed/Met Date Met:  08/06/2015 Patient has had no complaints of pain.  Orders are available for pain control if needed.

## 2015-08-12 NOTE — Progress Notes (Signed)
Pt did not tolerate bipap and took it off herself. Refuses to wear it. Placed on NRB.

## 2015-08-13 ENCOUNTER — Encounter (HOSPITAL_COMMUNITY): Payer: Self-pay | Admitting: Pulmonary Disease

## 2015-08-13 LAB — LEGIONELLA ANTIGEN, URINE

## 2015-08-13 LAB — HEMOGLOBIN A1C
Hgb A1c MFr Bld: 6.7 % — ABNORMAL HIGH (ref 4.8–5.6)
Mean Plasma Glucose: 146 mg/dL

## 2015-08-13 LAB — HEPATITIS PANEL, ACUTE
HCV Ab: <0.1 {s_co_ratio} (ref 0.0–0.9)
Hep A IgM: NEGATIVE
Hep B C IgM: NEGATIVE
Hepatitis B Surface Ag: NEGATIVE

## 2015-08-13 LAB — TROPONIN I: Troponin I: 9.31 ng/mL

## 2015-08-16 LAB — TYPE AND SCREEN
ABO/RH(D): O POS
Antibody Screen: NEGATIVE
UNIT DIVISION: 0
Unit division: 0

## 2015-08-16 LAB — CULTURE, BLOOD (ROUTINE X 2)
CULTURE: NO GROWTH
Culture: NO GROWTH

## 2015-08-25 NOTE — Discharge Summary (Addendum)
Death Summary  Lindsey Weber:[id number] DOB: April 12, 1941 DOA: August 30, 2015  PCP: [name], MD PCP/Office notified: Yes, copied d/c summary  Admit date: August 30, 2015 Date of Death: 01-Sep-2015  Final Diagnoses:  Principal Problem:   Acute on chronic respiratory failure with hypoxia ([place]) Active Problems:   COPD exacerbation ([place])   Acute renal failure superimposed on stage 3 chronic kidney disease ([place])   CAP (community acquired pneumonia)   Sepsis ([place])   Diabetes mellitus without complication ([place])   Aortic valve stenosis   NSTEMI (non-ST elevated myocardial infarction) ([place])   Acute on Chronic diastolic (congestive) heart failure (HCC)   Abnormal liver function   Peripheral vascular disease ([place])   Carotid stenosis, bilateral   Hyperlipemia   Hypertension   Microcytic anemia   GERD (gastroesophageal reflux disease)   Endobronchial mass  Bronchogenic Cancer    Cause of death is Cardiopulmonary arrest likely secondary to a combination of acute myocardial infarction/postobstructive pneumonia secondary to bronchogenic carcinoma  History of present illness:  Lindsey Weber is a 75 y.o. female with PMH of tobacco abuse (quit 3 weeks ago), COPD, hypertension, hyperlipidemia, diabetes mellitus, GERD, anxiety, bilateral carotid artery stenosis, PVD, aortic stenosis, chronic kidney disease-3, chronic diastolic congestive heart failure, who presents with productive cough and shortness of breath and she was found to have postobstructive pneumonia with suggestion of sepsis and had bronchoscopy which showed a friable endobronchial lesion. She developed NSTEMI and a 2-D echocardiogram showed an EF of 35-40%. Her respiratory condition continued to deteriorate during the course of the day post admission and she passed despite medical efforts including antibiotics/diuretics/oxygen supplementation. She was seen by pulmonary and cardiology for help with management. She did not want resuscitation  hence she expired on 08/09/2015 at 2346 hrs. Family informed of her passing. Please see notation below for more details of patient's brief hospital course.   Hospital Course:  07/28/2015: I have seen and examined Lindsey Weber at bedside and reviewed her chart. Appreciate pulmonary. Lindsey Weber is a 75 y.o. female with PMH of tobacco abuse (?quit 3 weeks ago), COPD, hypertension, hyperlipidemia, diabetes mellitus, GERD, anxiety, bilateral carotid artery stenosis, PVD, aortic stenosis, chronic kidney disease-3, chronic diastolic congestive heart failure, who presented with productive cough and shortness of breath and she was found to have acute on chronic respiratory failure with hypoxia in setting of COPD exacerbation/pneumonia-CAP/postobstructive with suggestion of sepsis- white count 29,000, transaminitis, acute on chronic kidney injury. There is concern for bronchogenic CA. Patient also has microcytic anemia. White count is increased to 30,000, likely with steroid element. Hemoglobin 7.2 g/dl. Patient has no prior history of colonoscopy/EGD. She has lost more than 20 pounds in the last 3 months. Will add Flagyl for anaerobic coverage, obtain iron studies/stool Hemoccult, transfuse 1 unit PRBC for tight get hemoglobin greater than 8 g/dL given cardiac history, follow pulmonary recommendations. She will likely need GI once cardiopulmonary issues settled.... She subsequently had bronchoscopy with finding of a friable endobronchial lesion. She then developed acute myocardial infarction requiring cardiology consultation. She had some hemoptysis post bronchoscopy. 338p: Troponin bumped to 9.31, Echo shows EF 35-40%. Will consult cardiology. 725p: Noted FOB findings of endobronchial lesion, shared results with family(daughter, daughter in law and 2 sons) who then went on to state that patient had hemoptysis in December 2016, and that she has been sick for a while she just didn't want to get evaluated. I discussed the  need for anticoagulation in view of NSTEMI, and risk of bleeding. Also discussed  with Dr.Sommer on call for pulmonary. Family deferred to the medical team to decide on way forward as far as anticoagulation is concerned. Patient has a very high risk for bleeding, given hemoptysis, anemia, therefore will hold off anticoagulation at this point and continue other interventions. Her family states that patient is DNR, which we will respect. Will change antibiotics to Zosyn/Zithromax as possible obstructive process likely playing a significant role. Will follow pulmonary/cardiology recommendations. Patient continued to have shortness of breath during the course of the evening and she died at 1146pm on Sep 08, 2015. The cause of death is cardiopulmonary arrest likely secondary to a combination of acute myocardial infarction/postobstructive pneumonia secondary to bronchogenic carcinoma. Family was informed by nursing staff of patient's demise. She was DNR/DNI Time: 15 minutes  Signed:  Kalandra Weber  [facility] 09/09/2015, 7:43 AM

## 2015-08-25 NOTE — Progress Notes (Signed)
Pt exhibiting increased dyspnea at rest and work of breathing by 2130. Pt refused BiPAP multiple times. Family updated by telephone around 2145 and arrived shortly after. Time of death 79 and NP notified. Checklist has been completed. Family left around 0000 with pt's belongings.   Bobbye Riggs, RN

## 2015-08-25 NOTE — Clinical Documentation Improvement (Signed)
Cardiology Internal Medicine  Can the diagnosis of CHF be further specified with the acuity , patient's BNP 1918.0 on admit?  Thanks    Acute on Chronic Diastolic Heart Failure  Acute Diastolic Heart Failure  Other  Clinically Undetermined   Document any associated diagnoses/conditions   Supporting Information: IV Lasix 40 mg IV given,  NSTEMI, Post Obstructive PNA   Please exercise your independent, professional judgment when responding. A specific answer is not anticipated or expected.   Thank You,  [place] [phone]

## 2015-08-25 DEATH — deceased
